# Patient Record
Sex: Female | Born: 1960 | Race: Black or African American | Hispanic: No | Smoking: Former smoker
Health system: Southern US, Community
[De-identification: ages and names within clinical notes are randomized; demographics above are authoritative.]

## PROBLEM LIST (undated history)

## (undated) DIAGNOSIS — I10 Essential (primary) hypertension: Secondary | ICD-10-CM

## (undated) DIAGNOSIS — G43909 Migraine, unspecified, not intractable, without status migrainosus: Secondary | ICD-10-CM

## (undated) DIAGNOSIS — K5900 Constipation, unspecified: Secondary | ICD-10-CM

## (undated) DIAGNOSIS — F419 Anxiety disorder, unspecified: Secondary | ICD-10-CM

## (undated) HISTORY — PX: ORTHOPEDIC SURGERY: SHX850

## (undated) HISTORY — PX: CHOLECYSTECTOMY: SHX55

## (undated) HISTORY — PX: TUBAL LIGATION: SHX77

---

## 2012-10-17 ENCOUNTER — Encounter (HOSPITAL_COMMUNITY): Payer: Self-pay | Admitting: *Deleted

## 2012-10-17 ENCOUNTER — Inpatient Hospital Stay (HOSPITAL_COMMUNITY)
Admission: EM | Admit: 2012-10-17 | Discharge: 2012-10-22 | DRG: 287 | Disposition: A | Discharge status: Home / Self Care | Payer: Medicare Other | Attending: Internal Medicine | Admitting: Internal Medicine

## 2012-10-17 ENCOUNTER — Emergency Department (HOSPITAL_COMMUNITY): Payer: Medicare Other

## 2012-10-17 DIAGNOSIS — F411 Generalized anxiety disorder: Secondary | ICD-10-CM | POA: Diagnosis present

## 2012-10-17 DIAGNOSIS — F4320 Adjustment disorder, unspecified: Secondary | ICD-10-CM

## 2012-10-17 DIAGNOSIS — R9439 Abnormal result of other cardiovascular function study: Secondary | ICD-10-CM | POA: Diagnosis not present

## 2012-10-17 DIAGNOSIS — R4189 Other symptoms and signs involving cognitive functions and awareness: Secondary | ICD-10-CM | POA: Diagnosis not present

## 2012-10-17 DIAGNOSIS — R55 Syncope and collapse: Secondary | ICD-10-CM

## 2012-10-17 DIAGNOSIS — R404 Transient alteration of awareness: Secondary | ICD-10-CM | POA: Diagnosis not present

## 2012-10-17 DIAGNOSIS — Z8249 Family history of ischemic heart disease and other diseases of the circulatory system: Secondary | ICD-10-CM

## 2012-10-17 DIAGNOSIS — R079 Chest pain, unspecified: Secondary | ICD-10-CM

## 2012-10-17 DIAGNOSIS — R7402 Elevation of levels of lactic acid dehydrogenase (LDH): Secondary | ICD-10-CM | POA: Diagnosis present

## 2012-10-17 DIAGNOSIS — R748 Abnormal levels of other serum enzymes: Secondary | ICD-10-CM

## 2012-10-17 DIAGNOSIS — K5909 Other constipation: Secondary | ICD-10-CM | POA: Diagnosis present

## 2012-10-17 DIAGNOSIS — R0789 Other chest pain: Principal | ICD-10-CM | POA: Diagnosis present

## 2012-10-17 DIAGNOSIS — R42 Dizziness and giddiness: Secondary | ICD-10-CM | POA: Diagnosis present

## 2012-10-17 DIAGNOSIS — K5900 Constipation, unspecified: Secondary | ICD-10-CM

## 2012-10-17 DIAGNOSIS — F4321 Adjustment disorder with depressed mood: Secondary | ICD-10-CM | POA: Diagnosis present

## 2012-10-17 DIAGNOSIS — R7401 Elevation of levels of liver transaminase levels: Secondary | ICD-10-CM

## 2012-10-17 HISTORY — DX: Anxiety disorder, unspecified: F41.9

## 2012-10-17 HISTORY — DX: Constipation, unspecified: K59.00

## 2012-10-17 LAB — CBC
Hemoglobin: 14.2 g/dL (ref 12.0–15.0)
MCHC: 34.7 g/dL (ref 30.0–36.0)
Platelets: 272 10*3/uL (ref 150–400)
RDW: 12.1 % (ref 11.5–15.5)

## 2012-10-17 LAB — BASIC METABOLIC PANEL
Calcium: 10.4 mg/dL (ref 8.4–10.5)
GFR calc Af Amer: >90 mL/min (ref >90)
GFR calc non Af Amer: >90 mL/min (ref >90)
Potassium: 4.1 mEq/L (ref 3.5–5.1)
Sodium: 138 mEq/L (ref 135–145)

## 2012-10-17 LAB — POCT I-STAT TROPONIN I: Troponin i, poc: 0.01 ng/mL (ref 0.00–0.08)

## 2012-10-17 MED ORDER — HYDROMORPHONE HCL PF 1 MG/ML IJ SOLN
1.0000 mg | Freq: Once | INTRAMUSCULAR | Status: AC
  Administered 2012-10-18: 1 mg via INTRAVENOUS
  Filled 2012-10-17: qty 1

## 2012-10-17 MED ORDER — ASPIRIN 81 MG PO CHEW
324.0000 mg | CHEWABLE_TABLET | Freq: Once | ORAL | Status: AC
  Administered 2012-10-17: 324 mg via ORAL
  Filled 2012-10-17: qty 4

## 2012-10-17 NOTE — ED Notes (Signed)
Pt returned from radiology.

## 2012-10-17 NOTE — ED Notes (Signed)
Pt c/o multiple "spells of passing out", last one was yesterday.  Also c/o CP and SOB, some diaphoresis.  Also c/o nausea, no vomiting.  Pt states her sister recently died and she has been stressed.

## 2012-10-17 NOTE — ED Provider Notes (Signed)
History     CSN: [id number]  Arrival date & time 10/17/12  2231   First MD Initiated Contact with Patient 10/17/12 2317      Chief Complaint  Patient presents with  . Chest Pain    (Consider location/radiation/quality/duration/timing/severity/associated sxs/prior treatment) HPI Hx per PT and family bedside.  CP and SOB with syncope yesterday.  PT states severe pain across her chest feels pressure like in quality, recent admit to [place], [place] hospital where she was admitted, told that over the weekend, there were no diagnostic tests available, so PT presents here with ongoing symptoms, no known cardiac history. Her sister recently passed away. No leg pain. Swelling or h/o PE/ DVT. No cough, fevers, chills, or injury related to syncope. No HA or neck pain.   Past Medical History  Diagnosis Date  . Anxiety   . Constipation     Past Surgical History  Procedure Laterality Date  . Tubal ligation      History reviewed. No pertinent family history.  History  Substance Use Topics  . Smoking status: Never Smoker   . Smokeless tobacco: Not on file  . Alcohol Use: No    OB History   Grav Para Term Preterm Abortions TAB SAB Ect Mult Living                  Review of Systems  Constitutional: Negative for fever and chills.  HENT: Negative for neck pain and neck stiffness.   Eyes: Negative for pain.  Respiratory: Positive for shortness of breath.   Cardiovascular: Positive for chest pain.  Gastrointestinal: Positive for nausea. Negative for abdominal pain.  Genitourinary: Negative for dysuria.  Musculoskeletal: Negative for back pain.  Skin: Negative for rash.  Neurological: Positive for syncope. Negative for seizures and headaches.  All other systems reviewed and are negative.    Allergies  Review of patient's allergies indicates no known allergies.  Home Medications   Current Outpatient Rx  Name  Route  Sig  Dispense  Refill  . polyethylene glycol (MIRALAX /  GLYCOLAX) packet   Oral   Take 17 g by mouth daily.         . SUMAtriptan (IMITREX) 50 MG tablet   Oral   Take 50 mg by mouth every 2 (two) hours as needed for migraine.         . venlafaxine XR (EFFEXOR-XR) 75 MG 24 hr capsule   Oral   Take 75 mg by mouth daily.           BP 139/89  Pulse 81  Temp(Src) 98 F (36.7 C) (Oral)  Resp 16  SpO2 98%  Physical Exam  Constitutional: She is oriented to person, place, and time. She appears well-developed and well-nourished.  HENT:  Head: Normocephalic and atraumatic.  Eyes: Conjunctivae and EOM are normal. Pupils are equal, round, and reactive to light.  Neck: Trachea normal. Neck supple. No thyromegaly present.  Cardiovascular: Normal rate, regular rhythm, S1 normal, S2 normal and normal pulses.     No systolic murmur is present   No diastolic murmur is present  Pulses:      Radial pulses are 2+ on the right side, and 2+ on the left side.  Pulmonary/Chest: Effort normal and breath sounds normal. She has no wheezes. She has no rhonchi. She has no rales. She exhibits no tenderness.  Abdominal: Soft. Normal appearance and bowel sounds are normal. There is no tenderness. There is no CVA tenderness and negative Murphy's sign.  Musculoskeletal:  BLE:s Calves nontender, no cords or erythema, negative Homans sign  Neurological: She is alert and oriented to person, place, and time. She has normal strength. No cranial nerve deficit or sensory deficit. GCS eye subscore is 4. GCS verbal subscore is 5. GCS motor subscore is 6.  Skin: Skin is warm and dry. No rash noted. She is not diaphoretic.  Psychiatric: Her speech is normal.  Cooperative and appropriate    ED Course  Procedures (including critical care time)  Results for orders placed during the hospital encounter of 10/17/12  CBC      Result Value Range   WBC 9.6  4.0 - 10.5 K/uL   RBC 4.77  3.87 - 5.11 MIL/uL   Hemoglobin 14.2  12.0 - 15.0 g/dL   HCT 16.1  09.6 - 04.5 %    MCV 85.7  78.0 - 100.0 fL   MCH 29.8  26.0 - 34.0 pg   MCHC 34.7  30.0 - 36.0 g/dL   RDW 40.9  81.1 - 91.4 %   Platelets 272  150 - 400 K/uL  BASIC METABOLIC PANEL      Result Value Range   Sodium 138  135 - 145 mEq/L   Potassium 4.1  3.5 - 5.1 mEq/L   Chloride 99  96 - 112 mEq/L   CO2 28  19 - 32 mEq/L   Glucose, Bld 119 (*) 70 - 99 mg/dL   BUN 8  6 - 23 mg/dL   Creatinine, Ser 7.82  0.50 - 1.10 mg/dL   Calcium 95.6  8.4 - 21.3 mg/dL   GFR calc non Af Amer >90  >90 mL/min   GFR calc Af Amer >90  >90 mL/min  POCT I-STAT TROPONIN I      Result Value Range   Troponin i, poc 0.01  0.00 - 0.08 ng/mL   Comment 3            Dg Chest 2 View  10/17/2012  *RADIOLOGY REPORT*  Clinical Data: Chest pain, shortness of breath, weakness, syncope 4 days ago.  CHEST - 2 VIEW  Comparison: None.  Findings: Shallow inspiration. The heart size and pulmonary vascularity are normal. The lungs appear clear and expanded without focal air space disease or consolidation. No blunting of the costophrenic angles.  No pneumothorax.  Mediastinal contours appear intact.  IMPRESSION: No evidence of active pulmonary disease.   Original Report Authenticated By: Burman Nieves, M.D.     Date: 10/17/2012  Rate: 77  Rhythm: normal sinus rhythm  QRS Axis: normal  Intervals: normal  ST/T Wave abnormalities: nonspecific ST changes  Conduction Disutrbances:none  Narrative Interpretation:   Old EKG Reviewed: none available  ASA. IV Dilaudid  MED consult for admit.   Old records requested from [place] recent admit  MDM  CP/ SOB / syncope in 52 yo female with recent death of a close family member  ECG, CXR, labs  ASA and IV narcotics, cardiac monitoring  MED admit        Sunnie Nielsen, MD 10/18/12 [phone]

## 2012-10-18 ENCOUNTER — Observation Stay (HOSPITAL_COMMUNITY): Payer: Medicare Other

## 2012-10-18 DIAGNOSIS — R7401 Elevation of levels of liver transaminase levels: Secondary | ICD-10-CM

## 2012-10-18 DIAGNOSIS — R55 Syncope and collapse: Secondary | ICD-10-CM

## 2012-10-18 DIAGNOSIS — F4321 Adjustment disorder with depressed mood: Secondary | ICD-10-CM

## 2012-10-18 DIAGNOSIS — R748 Abnormal levels of other serum enzymes: Secondary | ICD-10-CM

## 2012-10-18 DIAGNOSIS — K5900 Constipation, unspecified: Secondary | ICD-10-CM

## 2012-10-18 DIAGNOSIS — R079 Chest pain, unspecified: Secondary | ICD-10-CM

## 2012-10-18 LAB — COMPREHENSIVE METABOLIC PANEL
BUN: 10 mg/dL (ref 6–23)
CO2: 28 mEq/L (ref 19–32)
Calcium: 9.8 mg/dL (ref 8.4–10.5)
Creatinine, Ser: 0.72 mg/dL (ref 0.50–1.10)
GFR calc Af Amer: >90 mL/min (ref >90)
GFR calc non Af Amer: >90 mL/min (ref >90)
Glucose, Bld: 105 mg/dL — ABNORMAL HIGH (ref 70–99)

## 2012-10-18 LAB — HEMOGLOBIN A1C: Mean Plasma Glucose: 123 mg/dL — ABNORMAL HIGH (ref <117)

## 2012-10-18 LAB — CBC
Hemoglobin: 13.2 g/dL (ref 12.0–15.0)
MCH: 30.2 pg (ref 26.0–34.0)
MCV: 85.8 fL (ref 78.0–100.0)
RBC: 4.37 MIL/uL (ref 3.87–5.11)

## 2012-10-18 LAB — D-DIMER, QUANTITATIVE: D-Dimer, Quant: <0.27 ug/mL-FEU (ref 0.00–0.48)

## 2012-10-18 LAB — CK: Total CK: 260 U/L — ABNORMAL HIGH (ref 7–177)

## 2012-10-18 MED ORDER — NITROGLYCERIN 0.4 MG SL SUBL
SUBLINGUAL_TABLET | SUBLINGUAL | Status: AC
  Administered 2012-10-18: 20:00:00
  Filled 2012-10-18: qty 25

## 2012-10-18 MED ORDER — DOCUSATE SODIUM 100 MG PO CAPS
100.0000 mg | ORAL_CAPSULE | Freq: Two times a day (BID) | ORAL | Status: DC
  Administered 2012-10-18 – 2012-10-22 (×8): 100 mg via ORAL
  Filled 2012-10-18 (×10): qty 1

## 2012-10-18 MED ORDER — ACETAMINOPHEN 650 MG RE SUPP: 650.0000 mg | Freq: Four times a day (QID) | RECTAL | Status: DC | PRN

## 2012-10-18 MED ORDER — ONDANSETRON HCL 4 MG PO TABS: 4.0000 mg | ORAL_TABLET | Freq: Four times a day (QID) | ORAL | Status: DC | PRN

## 2012-10-18 MED ORDER — ONDANSETRON HCL 4 MG/2ML IJ SOLN
4.0000 mg | Freq: Four times a day (QID) | INTRAMUSCULAR | Status: DC | PRN
  Administered 2012-10-18: 4 mg via INTRAVENOUS
  Filled 2012-10-18: qty 2

## 2012-10-18 MED ORDER — HEPARIN SODIUM (PORCINE) 5000 UNIT/ML IJ SOLN
5000.0000 [IU] | Freq: Three times a day (TID) | INTRAMUSCULAR | Status: DC
  Administered 2012-10-18 – 2012-10-21 (×8): 5000 [IU] via SUBCUTANEOUS
  Filled 2012-10-18 (×13): qty 1

## 2012-10-18 MED ORDER — ASPIRIN EC 325 MG PO TBEC
325.0000 mg | DELAYED_RELEASE_TABLET | Freq: Every day | ORAL | Status: DC
  Administered 2012-10-18 – 2012-10-22 (×4): 325 mg via ORAL
  Filled 2012-10-18 (×5): qty 1

## 2012-10-18 MED ORDER — MECLIZINE HCL 25 MG PO TABS
25.0000 mg | ORAL_TABLET | Freq: Two times a day (BID) | ORAL | Status: DC | PRN
  Administered 2012-10-18: 25 mg via ORAL
  Filled 2012-10-18 (×2): qty 1

## 2012-10-18 MED ORDER — SODIUM CHLORIDE 0.9 % IJ SOLN
3.0000 mL | Freq: Two times a day (BID) | INTRAMUSCULAR | Status: DC
  Administered 2012-10-18 – 2012-10-21 (×8): 3 mL via INTRAVENOUS

## 2012-10-18 MED ORDER — POLYETHYLENE GLYCOL 3350 17 G PO PACK
17.0000 g | PACK | Freq: Two times a day (BID) | ORAL | Status: DC
  Administered 2012-10-18 – 2012-10-21 (×7): 17 g via ORAL
  Filled 2012-10-18 (×10): qty 1

## 2012-10-18 MED ORDER — POLYETHYLENE GLYCOL 3350 17 G PO PACK: 17.0000 g | PACK | Freq: Every day | ORAL | Status: DC | PRN

## 2012-10-18 MED ORDER — ACETAMINOPHEN 325 MG PO TABS: 650.0000 mg | ORAL_TABLET | Freq: Four times a day (QID) | ORAL | Status: DC | PRN

## 2012-10-18 MED ORDER — SODIUM CHLORIDE 0.9 % IJ SOLN
3.0000 mL | INTRAMUSCULAR | Status: DC | PRN
  Administered 2012-10-18: 3 mL via INTRAVENOUS

## 2012-10-18 MED ORDER — SODIUM CHLORIDE 0.9 % IV SOLN: 250.0000 mL | INTRAVENOUS | Status: DC | PRN

## 2012-10-18 MED ORDER — HEPARIN SODIUM (PORCINE) 5000 UNIT/ML IJ SOLN
5000.0000 [IU] | Freq: Three times a day (TID) | INTRAMUSCULAR | Status: DC
  Filled 2012-10-18: qty 1

## 2012-10-18 NOTE — Progress Notes (Signed)
[facility] PROGRESS NOTE  Stacy Bowman WUJ:[id number] DOB: [birth year] DOA: 10/17/2012 PCP: [name], MD  Assessment/Plan: 1. Chest pain: with typical and atypical features (left arm and neck pain, diaphoresis, SOB). EKG sinus rhythm, no acute changes. Cardiac enzymes negative thus far. No h/o CAD or cardiac testing. Will ask cardiology for opinion. Continue aspirin. Well's score 0, VTE doubted, will not proceed with any further evaluation. 2. Syncope, dizziness?: Etiology and significance unclear. No arrhythmia documented. Nonorganic etiology possible. Monitor on telemetry. Check 2-D echocardiogram. 3. Transaminitis: Etiology and significance unclear. Not on a statin. Repeat study in the morning. Suggest outpatient followup, consider hepatitis panel if not recently done. 4. Elevated CK: Seen at [facility] Repeat CPK with next blood draw.  5. Fatigue: TSH normal. Suspect secondary to stress, acute grief reaction. 6. Abdominal pain, constipation: Bowel regimen.  7. Grief, situational stress: Recently buried her sister.  Record review: Admitted to [place], [place] hospital 2/22-2/24 for chest pain. History and physical and progress notes available, no discharge summary, EKG. Serial cardiac enzymes were negative. Lab work was notable for CK over 1000 of unclear etiology. CT head negative, chest x-ray negative.  Per admission history and physical apparently as her admission was over the weekend no diagnostic tests were available so she came here for further evaluation of her pain.   Code Status: full code Family Communication: none present Disposition Plan: home when improved  Brendia Sacks, MD  [facility] Team 7 Pager [phone] If 7PM-7AM, please contact night-coverage at [email or web address], password [facility] 10/18/2012, 9:51 AM  LOS: 1 day   Brief narrative: 52 y.o. year old female chest pain. Patient was for similar symptoms at [facility] over the  weekend. Per patient and daughter patient had blood work as well as imaging done. Family is on sure results of imaging and tests. Patient was discharged yesterday without a formal diagnosis per report. Patient reports having central chest pain that radiates to the neck jaw/shoulder area with associated nausea and diaphoresis. Symptoms were similar over the weekend. Pain feels like someone sitting on her chest. Pain is sharp 9/10 at its worse. No alleviating or aggravating factors. Pain is not worsened with exertion. No past history of hypertension, diabetes. No family history of early heart attack. Patient also reports some intermittent dizziness with feeling of the room spinning going from sitting to standing. Patient also was having some left-sided weakness/numbness that has been going on the past 3 months. Primary care physician is in [place]. This has been discussed with primary care physician the past. Patient denies a formal workup. Symptoms have been fairly stable with no acute worsening today. Patient also for some mild abdominal pain. Nonspecific without radiation. Patient does have to strain have a bowel movement.  Consultants:  [facility] cardiology  Procedures:    HPI/Subjective: Afebrile, vital signs stable. No CP or SOB now. No complaints at the moment.   Objective: Filed Vitals:   10/18/12 0030 10/18/12 0100 10/18/12 0205 10/18/12 0523  BP: 137/88 131/66 125/78 114/71  Pulse: 76 82  72  Temp:   97.8 F (36.6 C) 98.3 F (36.8 C)  TempSrc:   Oral Oral  Resp: 14 13 16 16   Height:   5\' 4"  (1.626 m)   Weight:   81.4 kg (179 lb 7.3 oz)   SpO2: 92% 94% 100% 99%    Intake/Output Summary (Last 24 hours) at 10/18/12 0951 Last data filed at 10/18/12 0546  Gross per 24 hour  Intake  0 ml  Output    400 ml  Net   -400 ml   Filed Weights   10/18/12 0205  Weight: 81.4 kg (179 lb 7.3 oz)    Exam:  General:  Appears calm and comfortable. Keeps eyes closed during interview  and examination. Laconic.  Cardiovascular: RRR, no m/r/g. No LE edema. Telemetry: SR, no arrhythmias  Respiratory: CTA bilaterally, no w/r/r. Normal respiratory effort. Abdomen: soft, ntnd Psychiatric: Appears depressed. Flat affect. Speech fluent and clear.  Data Reviewed: Basic Metabolic Panel:  Recent Labs Lab 10/17/12 2248 10/18/12 0113 10/18/12 0719  NA 138  --  138  K 4.1  --  4.0  CL 99  --  100  CO2 28  --  28  GLUCOSE 119*  --  105*  BUN 8  --  10  CREATININE 0.79  --  0.72  CALCIUM 10.4  --  9.8  PHOS  --  4.2  --    Liver Function Tests:  Recent Labs Lab 10/18/12 0719  AST 106*  ALT 106*  ALKPHOS 86  BILITOT 0.2*  PROT 7.9  ALBUMIN 3.7   CBC:  Recent Labs Lab 10/17/12 2248 10/18/12 0719  WBC 9.6 7.8  HGB 14.2 13.2  HCT 40.9 37.5  MCV 85.7 85.8  PLT 272 241   Cardiac Enzymes:  Recent Labs Lab 10/18/12 0134 10/18/12 0718  TROPONINI <0.30 <0.30    Studies: Dg Chest 2 View  10/17/2012  *RADIOLOGY REPORT*  Clinical Data: Chest pain, shortness of breath, weakness, syncope 4 days ago.  CHEST - 2 VIEW  Comparison: None.  Findings: Shallow inspiration. The heart size and pulmonary vascularity are normal. The lungs appear clear and expanded without focal air space disease or consolidation. No blunting of the costophrenic angles.  No pneumothorax.  Mediastinal contours appear intact.  IMPRESSION: No evidence of active pulmonary disease.   Original Report Authenticated By: Burman Nieves, M.D.    Abd 1 View (kub)  10/18/2012  *RADIOLOGY REPORT*  Clinical Data: 52 year old female with abdominal pain and vomiting.  ABDOMEN - 1 VIEW  Comparison: None  Findings: The bowel gas pattern is unremarkable with stool and gas in the colon. No dilated bowel loops are present. Cholecystectomy clips are identified. No suspicious calcifications are present. The bony abnormalities are identified.  IMPRESSION: Unremarkable bowel gas pattern.  No evidence of acute  abnormality.   Original Report Authenticated By: Harmon Pier, M.D.     Scheduled Meds: . aspirin EC  325 mg Oral Daily  . heparin  5,000 Units Subcutaneous Q8H  . sodium chloride  3 mL Intravenous Q12H   Continuous Infusions:   Principal Problem:   Chest pain Active Problems:   Syncope   Transaminitis   Elevated CK   Unspecified constipation   Grief reaction     Brendia Sacks, MD  [facility] Team 7 Pager [phone] If 7PM-7AM, please contact night-coverage at [email or web address], password [facility] 10/18/2012, 9:51 AM  LOS: 1 day   Time spent: 25 minutes

## 2012-10-18 NOTE — Consult Note (Signed)
Admit date: 10/17/2012 Referring Physician  Dr. Alvester Morin Primary Physician  Dr. Barrington Ellison Primary Cardiologist  NONE Reason for Consultation  Chest pain  HPI: This is a 52 y.o. year old female who presented with chest pain. Patient was seen for similar symptoms at [facility] over the weekend. Per patient and daughter patient had blood work as well as imaging done. Family is unsure results of imaging and tests. Patient was discharged yesterday without a formal diagnosis per report. Patient reports having central chest pain that radiates to the neck jaw/shoulder area with associated nausea and diaphoresis. Symptoms were similar over the weekend. Pain feels like someone sitting on her chest. Pain is sharp 9/10 at its worse. No alleviating or aggravating factors. Pain is not worsened with exertion. No past history of hypertension, diabetes. No family history of early heart attack. Patient also reports some intermittent dizziness with feeling of the room spinning going from sitting to standing. Patient also was having some left-sided weakness/numbness that has been going on the past 3 months. Primary care physician is in [place]. This has been discussed with primary care physician the past. Patient denies a formal workup. Symptoms have been fairly stable with no acute worsening today. Patient also for some mild abdominal pain. Nonspecific without radiation. Patient does have to strain have a bowel movement. We are now asked to consult for further evaluation of chest pain.       PMH:   Past Medical History  Diagnosis Date  . Anxiety   . Constipation      PSH:   Past Surgical History  Procedure Laterality Date  . Tubal ligation      Allergies:  Review of patient's allergies indicates no known allergies. Prior to Admit Meds:   Prescriptions prior to admission  Medication Sig Dispense Refill  . polyethylene glycol (MIRALAX / GLYCOLAX) packet Take 17 g by mouth daily.      . SUMAtriptan  (IMITREX) 50 MG tablet Take 50 mg by mouth every 2 (two) hours as needed for migraine.      . venlafaxine XR (EFFEXOR-XR) 75 MG 24 hr capsule Take 75 mg by mouth daily.       Fam HX:   History reviewed. No pertinent family history. Social HX:    History   Social History  . Marital Status: Divorced    Spouse Name: N/A    Number of Children: N/A  . Years of Education: N/A   Occupational History  . Not on file.   Social History Main Topics  . Smoking status: Never Smoker   . Smokeless tobacco: Not on file  . Alcohol Use: No  . Drug Use: No  . Sexually Active: Not on file   Other Topics Concern  . Not on file   Social History Narrative  . No narrative on file     ROS:  All 11 ROS were addressed and are negative except what is stated in the HPI  Physical Exam: Blood pressure 114/71, pulse 72, temperature 98.3 F (36.8 C), temperature source Oral, resp. rate 16, height 5\' 4"  (1.626 m), weight 81.4 kg (179 lb 7.3 oz), SpO2 99.00%.    General: Well developed, well nourished, in no acute distress Head: Eyes PERRLA, No xanthomas.   Normal cephalic and atramatic  Lungs:   Clear bilaterally to auscultation and percussion. Heart:   HRRR S1 S2 Pulses are 2+ & equal.            No carotid bruit. No JVD.  No abdominal bruits. No femoral bruits. Abdomen: Bowel sounds are positive, abdomen soft and non-tender without masses  Extremities:   No clubbing, cyanosis or edema.  DP +1 Neuro: Alert and oriented X 3. Psych:  Good affect, responds appropriately    Labs:   Lab Results  Component Value Date   WBC 7.8 10/18/2012   HGB 13.2 10/18/2012   HCT 37.5 10/18/2012   MCV 85.8 10/18/2012   PLT 241 10/18/2012    Recent Labs Lab 10/18/12 0719  NA 138  K 4.0  CL 100  CO2 28  BUN 10  CREATININE 0.72  CALCIUM 9.8  PROT 7.9  BILITOT 0.2*  ALKPHOS 86  ALT 106*  AST 106*  GLUCOSE 105*   No results found for this basename: PTT   No results found for this basename: INR, PROTIME    Lab Results  Component Value Date   TROPONINI <0.30 10/18/2012         Radiology:  Dg Chest 2 View  10/17/2012  *RADIOLOGY REPORT*  Clinical Data: Chest pain, shortness of breath, weakness, syncope 4 days ago.  CHEST - 2 VIEW  Comparison: None.  Findings: Shallow inspiration. The heart size and pulmonary vascularity are normal. The lungs appear clear and expanded without focal air space disease or consolidation. No blunting of the costophrenic angles.  No pneumothorax.  Mediastinal contours appear intact.  IMPRESSION: No evidence of active pulmonary disease.   Original Report Authenticated By: Burman Nieves, M.D.    Abd 1 View (kub)  10/18/2012  *RADIOLOGY REPORT*  Clinical Data: 52 year old female with abdominal pain and vomiting.  ABDOMEN - 1 VIEW  Comparison: None  Findings: The bowel gas pattern is unremarkable with stool and gas in the colon. No dilated bowel loops are present. Cholecystectomy clips are identified. No suspicious calcifications are present. The bony abnormalities are identified.  IMPRESSION: Unremarkable bowel gas pattern.  No evidence of acute abnormality.   Original Report Authenticated By: Harmon Pier, M.D.    EKG:  NSR with nonspecific T wave abnormality  ASSESSMENT:  1.  Atypical and typical chest pain with negative troponin x 3 and normal EKG.  CRF include family history of CAD (mom with stents).  2.  Elevated CPK at outside hospital 3.  Transaminitis of ? etiology  PLAN:   1.  Repeat CPK 2.  Check 2D echo to eval LVF 3.  NPO after midnight for nuclear stress test in am  Quintella Reichert, MD  10/18/2012  12:07 PM

## 2012-10-18 NOTE — Progress Notes (Signed)
UR Completed.  Kobie Whidby Jane [phone] 10/18/2012  

## 2012-10-18 NOTE — Care Management Note (Unsigned)
    Page 1 of 1   10/18/2012     3:11:24 PM   CARE MANAGEMENT NOTE 10/18/2012  Patient:  Stacy Bowman, Stacy Bowman   Account Number:  [id number]  Date Initiated:  10/18/2012  Documentation initiated by:  Roneisha Stern  Subjective/Objective Assessment:   PT ADM WITH CP, SYNCOPE ON 10/17/12.  PTA, PT INDEPENDENT, LIVES WITH DAUGHTER.     Action/Plan:   WILL FOLLOW FOR HOME NEEDS AS PT PROGRESSES.   Anticipated DC Date:  10/19/2012   Anticipated DC Plan:  HOME/SELF CARE      DC Planning Services  CM consult      Choice offered to / List presented to:             Status of service:  In process, will continue to follow Medicare Important Message given?   (If response is "NO", the following Medicare IM given date fields will be blank) Date Medicare IM given:   Date Additional Medicare IM given:    Discharge Disposition:    Per UR Regulation:  Reviewed for med. necessity/level of care/duration of stay  If discussed at Long Length of Stay Meetings, dates discussed:    Comments:

## 2012-10-18 NOTE — Progress Notes (Signed)
Pt complained of chest pain, 8/10, feeling like pressure on her left chest.  One Nitroglycerin sublingual given. Pt states relief. VS stable, will continue to monitor.

## 2012-10-18 NOTE — Progress Notes (Signed)
*  PRELIMINARY RESULTS* Echocardiogram 2D Echocardiogram has been performed.  Stacy Bowman 10/18/2012, 4:36 PM

## 2012-10-18 NOTE — H&P (Signed)
Hospitalist Admission History and Physical  Patient name: Stacy Bowman Medical record number: [id number] Date of birth: 09/26/1960 Age: 52 y.o. Gender: female  Primary Care Provider: [name], MD  Chief Complaint: Chest Pain  History of Present Illness:This is a 52 y.o. year old female chest pain. Patient was for similar symptoms at [facility] over the weekend. Per patient and daughter patient had blood work as well as imaging done. Family is on sure results of imaging and tests. Patient was discharged yesterday without a formal diagnosis per report. Patient reports having central chest pain that radiates to the neck jaw/shoulder area with associated nausea and diaphoresis. Symptoms were similar over the weekend. Pain feels like someone sitting on her chest. Pain is sharp 9/10 at its worse. No alleviating or aggravating factors. Pain is not worsened with exertion. No past history of hypertension, diabetes. No family history of early heart attack. Patient also reports some intermittent dizziness with feeling of the room spinning going from sitting to standing. Patient also was having some left-sided weakness/numbness that has been going on the past 3 months. Primary care physician is in [place]. This has been discussed with primary care physician the past. Patient denies a formal workup. Symptoms have been fairly stable with no acute worsening today. Patient also for some mild abdominal pain. Nonspecific without radiation. Patient does have to strain have a bowel movement.  Per report from, patient's sister recently died last week to 2 liver failure. Patient with a prior history of depression. Patient has been under tremendous amount of stress related to this. Patient is also been very fatigued. No homicidal or suicidal ideations.  There is no problem list on file for this patient.  Past Medical History: Past Medical History  Diagnosis Date  . Anxiety   . Constipation      Past Surgical History: Past Surgical History  Procedure Laterality Date  . Tubal ligation      Social History: History   Social History  . Marital Status: Divorced    Spouse Name: N/A    Number of Children: N/A  . Years of Education: N/A   Social History Main Topics  . Smoking status: Never Smoker   . Smokeless tobacco: None  . Alcohol Use: No  . Drug Use: No  . Sexually Active: None   Other Topics Concern  . None   Social History Narrative  . None    Family History: History reviewed. No pertinent family history.  Allergies: No Known Allergies  Current Facility-Administered Medications  Medication Dose Route Frequency Provider Last Rate Last Dose  . 0.9 %  sodium chloride infusion  250 mL Intravenous PRN Doree Albee, MD      . acetaminophen (TYLENOL) tablet 650 mg  650 mg Oral Q6H PRN Doree Albee, MD       Or  . acetaminophen (TYLENOL) suppository 650 mg  650 mg Rectal Q6H PRN Doree Albee, MD      . aspirin EC tablet 325 mg  325 mg Oral Daily Doree Albee, MD      . heparin injection 5,000 Units  5,000 Units Subcutaneous Q8H Doree Albee, MD      . ondansetron [facility]) tablet 4 mg  4 mg Oral Q6H PRN Doree Albee, MD       Or  . ondansetron [facility]) injection 4 mg  4 mg Intravenous Q6H PRN Doree Albee, MD      . polyethylene glycol (MIRALAX / GLYCOLAX) packet 17 g  17 g Oral  Daily PRN Doree Albee, MD      . sodium chloride 0.9 % injection 3 mL  3 mL Intravenous Q12H Doree Albee, MD      . sodium chloride 0.9 % injection 3 mL  3 mL Intravenous PRN Doree Albee, MD       Current Outpatient Prescriptions  Medication Sig Dispense Refill  . polyethylene glycol (MIRALAX / GLYCOLAX) packet Take 17 g by mouth daily.      . SUMAtriptan (IMITREX) 50 MG tablet Take 50 mg by mouth every 2 (two) hours as needed for migraine.      . venlafaxine XR (EFFEXOR-XR) 75 MG 24 hr capsule Take 75 mg by mouth daily.       Review Of Systems: 12 point ROS negative  except as noted above in HPI.  Physical Exam: Filed Vitals:   10/18/12 0030  BP: 137/88  Pulse: 76  Temp:   Resp: 14    General: alert and cooperative HEENT: PERRLA and extra ocular movement intact Heart: S1, S2 normal, no murmur, rub or gallop, regular rate and rhythm Lungs: clear to auscultation, no wheezes or rales and unlabored breathing Abdomen: abdomen is soft without significant tenderness, masses, organomegaly or guarding Extremities: extremities normal, atraumatic, no cyanosis or edema Skin:no rashes Neurology: normal without focal findings, mental status, speech normal, alert and oriented x3, PERLA, reflexes normal and symmetric and dix hallpike mildly positive   Labs and Imaging: Lab Results  Component Value Date/Time   NA 138 10/17/2012 10:48 PM   K 4.1 10/17/2012 10:48 PM   CL 99 10/17/2012 10:48 PM   CO2 28 10/17/2012 10:48 PM   BUN 8 10/17/2012 10:48 PM   CREATININE 0.79 10/17/2012 10:48 PM   GLUCOSE 119* 10/17/2012 10:48 PM   Lab Results  Component Value Date   WBC 9.6 10/17/2012   HGB 14.2 10/17/2012   HCT 40.9 10/17/2012   MCV 85.7 10/17/2012   PLT 272 10/17/2012   Dg Chest 2 View  10/17/2012  *RADIOLOGY REPORT*  Clinical Data: Chest pain, shortness of breath, weakness, syncope 4 days ago.  CHEST - 2 VIEW  Comparison: None.  Findings: Shallow inspiration. The heart size and pulmonary vascularity are normal. The lungs appear clear and expanded without focal air space disease or consolidation. No blunting of the costophrenic angles.  No pneumothorax.  Mediastinal contours appear intact.  IMPRESSION: No evidence of active pulmonary disease.   Original Report Authenticated By: Burman Nieves, M.D.    Troponin (Point of Care Test)  Recent Labs  10/17/12 2311  TROPIPOC 0.01   EKG: normal EKG, normal sinus rhythm.    Assessment and Plan: Stacy Bowman is a 52 y.o. year old female presenting with chest pain.   Chest Pain: We'll proceed with chest pain rule  out. EKG normal sinus rhythm. Point care troponins within normal limits. We'll cycle cardiac enzymes. Risk stratification labs. I suspect the patient had a similar workup with recent admission. Medical records are pending. Patient given full dose aspirin in the interim. Cards consult in the a.m. pending medical records.  Fatigue: Suspect that mood/depression/poor sleep in setting of sister recently dying may be playing a large part of this. No anemia on CBC. TSH is pending. We'll check vitamin D level. Pending medical records from [place].   Dizziness: Clinically consistent with vertigo. Will start on meclizine. Will on any neuroimaging pending medical records. Neurologically intact apart from vertiginous sxs.   Abdominal pain: Fairly nonspecific but no palpable tenderness on my  exam today. Suspect this may be secondary to constipation. Will check a KUB. Outside hospital records pending.  FEN/GI: Heart healthy diet. Prophylaxis: Subcutaneous heparin. PPI Disposition: Pending further evaluation Code Status: Full Code        Doree Albee MD  Pager: [phone]

## 2012-10-19 ENCOUNTER — Encounter (HOSPITAL_COMMUNITY): Payer: Medicare Other

## 2012-10-19 ENCOUNTER — Observation Stay (HOSPITAL_COMMUNITY): Payer: Medicare Other

## 2012-10-19 LAB — HEPATIC FUNCTION PANEL
ALT: 114 U/L — ABNORMAL HIGH (ref 0–35)
Bilirubin, Direct: <0.1 mg/dL (ref 0.0–0.3)

## 2012-10-19 MED ORDER — TECHNETIUM TC 99M SESTAMIBI GENERIC - CARDIOLITE
10.0000 | Freq: Once | INTRAVENOUS | Status: AC | PRN
  Administered 2012-10-19: 10 via INTRAVENOUS

## 2012-10-19 MED ORDER — REGADENOSON 0.4 MG/5ML IV SOLN
0.4000 mg | Freq: Once | INTRAVENOUS | Status: AC
  Administered 2012-10-19: 0.4 mg via INTRAVENOUS
  Filled 2012-10-19: qty 5

## 2012-10-19 MED ORDER — TECHNETIUM TC 99M SESTAMIBI GENERIC - CARDIOLITE
30.0000 | Freq: Once | INTRAVENOUS | Status: AC | PRN
  Administered 2012-10-19: 30 via INTRAVENOUS

## 2012-10-19 NOTE — Progress Notes (Signed)
SUBJECTIVE:  Had recurrent cp relieved with NTG  OBJECTIVE:   Vitals:   Filed Vitals:   10/18/12 1436 10/18/12 2000 10/19/12 0424 10/19/12 0500  BP: 119/74 111/75 112/68   Pulse: 75 88 87   Temp: 98 F (36.7 C) 98.7 F (37.1 C) 98.3 F (36.8 C)   TempSrc: Oral Oral Oral   Resp: 18 18 16    Height:      Weight:    80.9 kg (178 lb 5.6 oz)  SpO2: 99% 98% 95%    I&O's:   Intake/Output Summary (Last 24 hours) at 10/19/12 0748 Last data filed at 10/18/12 1300  Gross per 24 hour  Intake    240 ml  Output      0 ml  Net    240 ml   TELEMETRY: Reviewed telemetry pt in NSR:     PHYSICAL EXAM General: Well developed, well nourished, in no acute distress Head: Eyes PERRLA, No xanthomas.   Normal cephalic and atramatic  Lungs:   Clear bilaterally to auscultation and percussion. Heart:   HRRR S1 S2 Pulses are 2+ & equal.            No carotid bruit. No JVD.  No abdominal bruits. No femoral bruits. Abdomen: Bowel sounds are positive, abdomen soft and non-tender without masses Extremities:   No clubbing, cyanosis or edema.  DP +1 Neuro: Alert and oriented X 3. Psych:  Good affect, responds appropriately   LABS: Basic Metabolic Panel:  Recent Labs  08/65/78 2248 10/18/12 0113 10/18/12 0719  NA 138  --  138  K 4.1  --  4.0  CL 99  --  100  CO2 28  --  28  GLUCOSE 119*  --  105*  BUN 8  --  10  CREATININE 0.79  --  0.72  CALCIUM 10.4  --  9.8  PHOS  --  4.2  --    Liver Function Tests:  Recent Labs  10/18/12 0719  AST 106*  ALT 106*  ALKPHOS 86  BILITOT 0.2*  PROT 7.9  ALBUMIN 3.7   No results found for this basename: LIPASE, AMYLASE,  in the last 72 hours CBC:  Recent Labs  10/17/12 2248 10/18/12 0719  WBC 9.6 7.8  HGB 14.2 13.2  HCT 40.9 37.5  MCV 85.7 85.8  PLT 272 241   Cardiac Enzymes:  Recent Labs  10/18/12 0134 10/18/12 0718 10/18/12 1238  CKTOTAL  --   --  260*  TROPONINI <0.30 <0.30 <0.30   BNP: No components found with this  basename: POCBNP,  D-Dimer:  Recent Labs  10/17/12 2348  DDIMER <0.27   Hemoglobin A1C:  Recent Labs  10/18/12 0719  HGBA1C 5.9*   Fasting Lipid Panel: No results found for this basename: CHOL, HDL, LDLCALC, TRIG, CHOLHDL, LDLDIRECT,  in the last 72 hours Thyroid Function Tests:  Recent Labs  10/18/12 0113  TSH 2.867   Anemia Panel: No results found for this basename: VITAMINB12, FOLATE, FERRITIN, TIBC, IRON, RETICCTPCT,  in the last 72 hours Coag Panel:   No results found for this basename: INR, PROTIME    RADIOLOGY: Dg Chest 2 View  10/17/2012  *RADIOLOGY REPORT*  Clinical Data: Chest pain, shortness of breath, weakness, syncope 4 days ago.  CHEST - 2 VIEW  Comparison: None.  Findings: Shallow inspiration. The heart size and pulmonary vascularity are normal. The lungs appear clear and expanded without focal air space disease or consolidation. No blunting of the costophrenic angles.  No pneumothorax.  Mediastinal contours appear intact.  IMPRESSION: No evidence of active pulmonary disease.   Original Report Authenticated By: Burman Nieves, M.D.    Abd 1 View (kub)  10/18/2012  *RADIOLOGY REPORT*  Clinical Data: 52 year old female with abdominal pain and vomiting.  ABDOMEN - 1 VIEW  Comparison: None  Findings: The bowel gas pattern is unremarkable with stool and gas in the colon. No dilated bowel loops are present. Cholecystectomy clips are identified. No suspicious calcifications are present. The bony abnormalities are identified.  IMPRESSION: Unremarkable bowel gas pattern.  No evidence of acute abnormality.   Original Report Authenticated By: Harmon Pier, M.D.    ASSESSMENT:  1. Atypical and typical chest pain with negative troponin x 3 and normal EKG. CRF include family history of CAD (mom with stents).  2. Elevated CPK at outside hospital  3. Transaminitis of ? etiology  PLAN:  1. Check 2D echo results  to eval LVF  2.  nuclear stress test today      Quintella Reichert, MD  10/19/2012  7:48 AM

## 2012-10-19 NOTE — Progress Notes (Signed)
[facility] PROGRESS NOTE  Stacy Bowman WUJ:[id number] DOB: 04/29/1961 DOA: 10/17/2012 PCP: [name], MD  Brief narrative: 52 y.o. year old female chest pain. Patient was for similar symptoms at [facility] over the weekend. Per patient and daughter patient had blood work as well as imaging done. Family is on sure results of imaging and tests. Patient was discharged yesterday without a formal diagnosis per report. Patient reports having central chest pain that radiates to the neck jaw/shoulder area with associated nausea and diaphoresis. Symptoms were similar over the weekend. Pain feels like someone sitting on her chest. Pain is sharp 9/10 at its worse. No alleviating or aggravating factors. Pain is not worsened with exertion. No past history of hypertension, diabetes. No family history of early heart attack. Patient also reports some intermittent dizziness with feeling of the room spinning going from sitting to standing. Patient also was having some left-sided weakness/numbness that has been going on the past 3 months. Primary care physician is in [place]. This has been discussed with primary care physician the past. Patient denies a formal workup. Symptoms have been fairly stable with no acute worsening today. Patient also for some mild abdominal pain. Nonspecific without radiation. Patient does have to strain have a bowel movement.  Assessment/Plan: 1. Chest pain: with typical and atypical features (left arm and neck pain, diaphoresis, SOB). EKG sinus rhythm, no acute changes. Cardiac enzymes negative thus far. Stress test done today, results pending. Appreciate cardiology input.  2. Syncope, dizziness?: Etiology and significance unclear. No arrhythmia documented. Nonorganic etiology possible. Monitor on telemetry. Check 2-D echocardiogram. 3. Transaminitis: Etiology and significance unclear. Not on a statin. Repeat study in the morning. Suggest outpatient followup,  consider hepatitis panel if not recently done. 4. Elevated CK: Seen at [facility]. Repeat CPK with next blood draw.  5. Fatigue: TSH normal. Suspect secondary to stress, acute grief reaction. 6. Abdominal pain, constipation: Bowel regimen.  7. Grief, situational stress: Recently buried her sister.  Code Status: full code Family Communication: none present Disposition Plan: home when improved  Consultants:  [facility]  Procedures:  Stress test 10/19/2012 - results pending.   HPI/Subjective: - complains of being hungry today.   Objective: Filed Vitals:   10/19/12 1256 10/19/12 1258 10/19/12 1259 10/19/12 1440  BP: 98/68  113/77 117/75  Pulse: 110 132 118 94  Temp:    98.5 F (36.9 C)  TempSrc:    Stacy  Resp:    18  Height:      Weight:      SpO2:    95%   No intake or output data in the 24 hours ending 10/19/12 1513 Filed Weights   10/18/12 0205 10/19/12 0500  Weight: 81.4 kg (179 lb 7.3 oz) 80.9 kg (178 lb 5.6 oz)    Exam: General:  Appears calm and comfortable. Keeps eyes closed during interview and examination. Cardiovascular: RRR, no m/r/g. No LE edema. Telemetry: SR, no arrhythmias  Respiratory: CTA bilaterally, no w/r/r. Normal respiratory effort. Abdomen: soft, ntnd Psychiatric: Appears depressed. Flat affect. Speech fluent and clear.  Data Reviewed: Basic Metabolic Panel:  Recent Labs Lab 10/17/12 2248 10/18/12 0113 10/18/12 0719  NA 138  --  138  K 4.1  --  4.0  CL 99  --  100  CO2 28  --  28  GLUCOSE 119*  --  105*  BUN 8  --  10  CREATININE 0.79  --  0.72  CALCIUM 10.4  --  9.8  PHOS  --  4.2  --    Liver Function Tests:  Recent Labs Lab 10/18/12 0719  AST 106*  ALT 106*  ALKPHOS 86  BILITOT 0.2*  PROT 7.9  ALBUMIN 3.7   CBC:  Recent Labs Lab 10/17/12 2248 10/18/12 0719  WBC 9.6 7.8  HGB 14.2 13.2  HCT 40.9 37.5  MCV 85.7 85.8  PLT 272 241   Cardiac Enzymes:  Recent Labs Lab 10/18/12 0134  10/18/12 0718 10/18/12 1238  CKTOTAL  --   --  260*  TROPONINI <0.30 <0.30 <0.30   Studies: Dg Chest 2 View  10/17/2012  *RADIOLOGY REPORT*  Clinical Data: Chest pain, shortness of breath, weakness, syncope 4 days ago.  CHEST - 2 VIEW  Comparison: None.  Findings: Shallow inspiration. The heart size and pulmonary vascularity are normal. The lungs appear clear and expanded without focal air space disease or consolidation. No blunting of the costophrenic angles.  No pneumothorax.  Mediastinal contours appear intact.  IMPRESSION: No evidence of active pulmonary disease.   Original Report Authenticated By: Burman Nieves, M.D.    Abd 1 View (kub)  10/18/2012  *RADIOLOGY REPORT*  Clinical Data: 52 year old female with abdominal pain and vomiting.  ABDOMEN - 1 VIEW  Comparison: None  Findings: The bowel gas pattern is unremarkable with stool and gas in the colon. No dilated bowel loops are present. Cholecystectomy clips are identified. No suspicious calcifications are present. The bony abnormalities are identified.  IMPRESSION: Unremarkable bowel gas pattern.  No evidence of acute abnormality.   Original Report Authenticated By: Harmon Pier, M.D.     Scheduled Meds: . aspirin EC  325 mg Stacy Daily  . docusate sodium  100 mg Stacy BID  . heparin  5,000 Units Subcutaneous Q8H  . polyethylene glycol  17 g Stacy BID  . sodium chloride  3 mL Intravenous Q12H   Continuous Infusions:   Principal Problem:   Chest pain Active Problems:   Syncope   Transaminitis   Elevated CK   Unspecified constipation   Grief reaction   Stacy Bowman M. Elvera Lennox, MD [facility] [phone] If 7PM-7AM, please contact night-coverage at [email or web address], password [facility] 10/19/2012, 3:13 PM  LOS: 2 days

## 2012-10-20 ENCOUNTER — Inpatient Hospital Stay (HOSPITAL_COMMUNITY): Payer: Medicare Other

## 2012-10-20 ENCOUNTER — Observation Stay (HOSPITAL_COMMUNITY): Payer: Medicare Other

## 2012-10-20 DIAGNOSIS — R4189 Other symptoms and signs involving cognitive functions and awareness: Secondary | ICD-10-CM | POA: Diagnosis not present

## 2012-10-20 DIAGNOSIS — R404 Transient alteration of awareness: Secondary | ICD-10-CM | POA: Diagnosis not present

## 2012-10-20 DIAGNOSIS — F4321 Adjustment disorder with depressed mood: Secondary | ICD-10-CM

## 2012-10-20 LAB — BASIC METABOLIC PANEL
BUN: 12 mg/dL (ref 6–23)
CO2: 26 mEq/L (ref 19–32)
Calcium: 9.9 mg/dL (ref 8.4–10.5)
Chloride: 100 mEq/L (ref 96–112)
Creatinine, Ser: 0.68 mg/dL (ref 0.50–1.10)
Glucose, Bld: 108 mg/dL — ABNORMAL HIGH (ref 70–99)

## 2012-10-20 LAB — HEPATIC FUNCTION PANEL
AST: 66 U/L — ABNORMAL HIGH (ref 0–37)
Albumin: 3.8 g/dL (ref 3.5–5.2)
Total Bilirubin: 0.2 mg/dL — ABNORMAL LOW (ref 0.3–1.2)
Total Protein: 8.3 g/dL (ref 6.0–8.3)

## 2012-10-20 LAB — CBC
HCT: 42 % (ref 36.0–46.0)
Hemoglobin: 14.9 g/dL (ref 12.0–15.0)
MCH: 30.5 pg (ref 26.0–34.0)
MCV: 85.9 fL (ref 78.0–100.0)
RBC: 4.89 MIL/uL (ref 3.87–5.11)

## 2012-10-20 NOTE — Procedures (Signed)
EEG report.  Brief clinical history:  52 years old female who sustained an isolated episode of brief alteration of consciousness. Differential diagnosis is syncope versus seizures. No prior history of frank epileptic seizures.  Technique: this is a 17 channel routine scalp EEG performed at the bedside with bipolar and monopolar montages arranged in accordance to the international 10/20 system of electrode placement. One channel was dedicated to EKG recording.  No drowsiness or sleep achieved during the test. Hyperventilation and intermittent photic stimulation were utilized as activating procedures.  Description:In the wakeful state, the best background consisted of a medium amplitude, posterior dominant, well sustained, symmetric and reactive 11 Hz rhythm. Hyperventilation did not induce physiologic slowing or epileptiform discharges. Intermittent photic stimulation did induce a normal driving response.  No focal or generalized epileptiform discharges noted.  No pathologic areas of slowing seen.  EKG showed sinus rhythm.  Impression: this is a normal awake EEG. Please, be aware that a normal EEG does not exclude the possibility of epilepsy.  Clinical correlation is advised.  Wyatt Portela, MD

## 2012-10-20 NOTE — Progress Notes (Signed)
EEG completed at bedside 

## 2012-10-20 NOTE — Consult Note (Signed)
Referring Physician: Dr. Mayford Knife.    Chief Complaint: unresponsiveness. Code stroke.  HPI:                                                                                                                                         Stacy Bowman is an 52 y.o. female with a past medical history significant for anxiety, admitted to the hospital due to atypical chest pain, who was doing well, eating breakfast and watching TV when suddenly became unresponsive to verbal commands and sternal rub. There is no report of focal neurological findings upon initial evaluation by the rapid response nurse and her BP was 160/90. Blood sugar 119. When I got to her bedside, she was alert, awake, and oriented with no lateralizing neurological signs and complaining of feeling tired. Urgent CT brain showed no acute intracranial abnormality consistent with ischemia or hemorrhage.   LSN: around 8:45 am. tPA Given: no, patient back to baseline with NIHSS 0.  Past Medical History  Diagnosis Date  . Anxiety   . Constipation     Past Surgical History  Procedure Laterality Date  . Tubal ligation      History reviewed. No pertinent family history. Social History:  reports that she has never smoked. She does not have any smokeless tobacco history on file. She reports that she does not drink alcohol or use illicit drugs.  Allergies: No Known Allergies  Medications:                                                                                                                           I have reviewed the patient's current medications.  ROS:  History obtained from chart review  General ROS: negative for - chills, fatigue, fever, night sweats, weight gain or weight loss Psychological ROS: negative for - behavioral disorder, hallucinations, memory difficulties, mood swings or suicidal  ideation Ophthalmic ROS: negative for - blurry vision, double vision, eye pain or loss of vision ENT ROS: negative for - epistaxis, nasal discharge, oral lesions, sore throat, tinnitus or vertigo Allergy and Immunology ROS: negative for - hives or itchy/watery eyes Hematological and Lymphatic ROS: negative for - bleeding problems, bruising or swollen lymph nodes Endocrine ROS: negative for - galactorrhea, hair pattern changes, polydipsia/polyuria or temperature intolerance Respiratory ROS: negative for - cough, hemoptysis, shortness of breath or wheezing Cardiovascular ROS: significant forchest pain. Gastrointestinal ROS: significant for mild abdominal pain.  Genito-Urinary ROS: negative for - dysuria, hematuria, incontinence or urinary frequency/urgency Musculoskeletal ROS: negative for - joint swelling or muscular weakness Neurological ROS: as noted in HPI Dermatological ROS: negative for rash and skin lesion changes     Physical exam: pleasant female in no apparent distress.Blood pressure 149/84, pulse 84, temperature 98.4 F (36.9 C), temperature source Oral, resp. rate 12, height 5\' 4"  (1.626 m), weight 81.6 kg (179 lb 14.3 oz), SpO2 98.00%. Head: normocephalic. Neck: supple, no bruits, no JVD. Cardiac: no murmurs. Lungs: clear. Abdomen: soft, no tender, no mass. Extremities: no edema.   Neurologic Examination:                                                                                                      Mental Status: Alert, oriented, thought content appropriate.  Speech fluent without evidence of aphasia.  Able to follow 3 step commands without difficulty. Cranial Nerves: II: Discs flat bilaterally; Visual fields grossly normal, pupils equal, round, reactive to light and accommodation III,IV, VI: ptosis not present, extra-ocular motions intact bilaterally V,VII: smile symmetric, facial light touch sensation normal bilaterally VIII: hearing normal bilaterally IX,X: gag  reflex present XI: bilateral shoulder shrug XII: midline tongue extension Motor: Right : Upper extremity   5/5    Left:     Upper extremity   5/5  Lower extremity   5/5     Lower extremity   5/5 Tone and bulk:normal tone throughout; no atrophy noted Sensory: Pinprick and light touch intact throughout, bilaterally Deep Tendon Reflexes: 2+ and symmetric throughout Plantars: Right: downgoing   Left: downgoing Cerebellar: normal finger-to-nose,  normal heel-to-shin test Gait: no ataxia. CV: pulses palpable throughout       Results for orders placed during the hospital encounter of 10/17/12 (from the past 48 hour(s))  TROPONIN I     Status: None   Collection Time    10/18/12 12:38 PM      Result Value Range   Troponin I <0.30  <0.30 ng/mL   Comment:            Due to the release kinetics of cTnI,     a negative result within the first hours     of the onset of symptoms does not rule out     myocardial infarction with certainty.  If myocardial infarction is still suspected,     repeat the test at appropriate intervals.  CK     Status: Abnormal   Collection Time    10/18/12 12:38 PM      Result Value Range   Total CK 260 (*) 7 - 177 U/L  HEPATIC FUNCTION PANEL     Status: Abnormal   Collection Time    10/19/12  3:29 PM      Result Value Range   Total Protein 8.6 (*) 6.0 - 8.3 g/dL   Albumin 3.9  3.5 - 5.2 g/dL   AST 83 (*) 0 - 37 U/L   ALT 114 (*) 0 - 35 U/L   Alkaline Phosphatase 93  39 - 117 U/L   Total Bilirubin 0.2 (*) 0.3 - 1.2 mg/dL   Bilirubin, Direct <1.6  0.0 - 0.3 mg/dL   Indirect Bilirubin NOT CALCULATED  0.3 - 0.9 mg/dL  HEPATIC FUNCTION PANEL     Status: Abnormal   Collection Time    10/20/12  6:55 AM      Result Value Range   Total Protein 8.3  6.0 - 8.3 g/dL   Albumin 3.8  3.5 - 5.2 g/dL   AST 66 (*) 0 - 37 U/L   ALT 110 (*) 0 - 35 U/L   Alkaline Phosphatase 92  39 - 117 U/L   Total Bilirubin 0.2 (*) 0.3 - 1.2 mg/dL   Bilirubin, Direct <1.0  0.0  - 0.3 mg/dL   Indirect Bilirubin NOT CALCULATED  0.3 - 0.9 mg/dL  CBC     Status: None   Collection Time    10/20/12  6:55 AM      Result Value Range   WBC 8.1  4.0 - 10.5 K/uL   RBC 4.89  3.87 - 5.11 MIL/uL   Hemoglobin 14.9  12.0 - 15.0 g/dL   HCT 96.0  45.4 - 09.8 %   MCV 85.9  78.0 - 100.0 fL   MCH 30.5  26.0 - 34.0 pg   MCHC 35.5  30.0 - 36.0 g/dL   RDW 11.9  14.7 - 82.9 %   Platelets 259  150 - 400 K/uL  BASIC METABOLIC PANEL     Status: Abnormal   Collection Time    10/20/12  6:55 AM      Result Value Range   Sodium 138  135 - 145 mEq/L   Potassium 4.1  3.5 - 5.1 mEq/L   Chloride 100  96 - 112 mEq/L   CO2 26  19 - 32 mEq/L   Glucose, Bld 108 (*) 70 - 99 mg/dL   BUN 12  6 - 23 mg/dL   Creatinine, Ser 5.62  0.50 - 1.10 mg/dL   Calcium 9.9  8.4 - 13.0 mg/dL   GFR calc non Af Amer >90  >90 mL/min   GFR calc Af Amer >90  >90 mL/min   Comment:            The eGFR has been calculated     using the CKD EPI equation.     This calculation has not been     validated in all clinical     situations.     eGFR's persistently     <90 mL/min signify     possible Chronic Kidney Disease.  GLUCOSE, CAPILLARY     Status: Abnormal   Collection Time    10/20/12  9:00 AM      Result Value Range  Glucose-Capillary 183 (*) 70 - 99 mg/dL   Comment 1 Documented in Chart     Comment 2 Notify RN     Nm Myocar Multi W/spect W/wall Motion / Ef  10/19/2012  *RADIOLOGY REPORT*  Clinical Data:  Chest pain  MYOCARDIAL IMAGING WITH SPECT (REST AND PHARMACOLOGIC-STRESS) GATED LEFT VENTRICULAR WALL MOTION STUDY LEFT VENTRICULAR EJECTION FRACTION  Technique:  Standard myocardial SPECT imaging was performed after resting intravenous injection of 10 mCi Tc-13m sestamibi. Subsequently, intravenous infusion of Lexiscan was performed under the supervision of the Cardiology staff.  At peak effect of the drug, 30 mCi Tc-30m sestamibi was injected intravenously and standard myocardial SPECT  imaging was  performed.  Quantitative gated imaging was also performed to evaluate left ventricular wall motion, and estimate left ventricular ejection fraction.  Comparison:  None  Findings: SPECT imaging demonstrates slight area of apparent reversibility within the anterior wall and apex.  This does not to quantify as significant by QPS software (7% reversibility).  No fixed defect to suggest infarct.  Quantitative gated analysis shows normal wall motion.  The resting left ventricular ejection fraction is 47% with end- diastolic volume of 53 ml and end-systolic volume of 33 ml.  IMPRESSION: Subtle area of reversibility in the anterior wall and apex.  This does not quantify significant by QPS software.  Recommend correlation with clinical symptoms and EKG changes to exclude ischemia.  Ejection fraction 47%.   Original Report Authenticated By: Charlett Nose, M.D.       Assessment: 51 y.o. female with acute onset transient unresponsiveness. Her neuro-exam is normal and CT brain showed no acute intracranial abnormality. She is metabolically intact and has no risk factors for epilepsy. Significant stress resulting from recent death of her sister. Will get EEG and see her after completion of the test.    Wyatt Portela, MD [facility].  10/20/2012 9:33 am.

## 2012-10-20 NOTE — Significant Event (Addendum)
Rapid Response Event Note  Overview:  Called to see patient who was found unresponsive Time Called: 0900 Arrival Time: 0903 Event Type: Neurologic  Initial Focused Assessment: On arrival patient supine in bed - skin warm and dry - not responding to voice - deep pinch to upper arms results in groan - eyes midline - pupil 3mm ERL - good muscle tone in all 4's - controls arms to prevent hitting face when lifted above head - no incontinence noted - RN Aundra Millet states patient was alert and eating breakfast about 15 minutes prior to event. - Dr. Mayford Knife walked in and patient was not arousable.  CBG 183.  BP 165/98 HR 101 RR 22 O2sats 100% on RA.    Interventions:  After few minutes of stimulation patient began to arouse =- opening eyes and eventually speaking - states she is not sure what happened.  Dr Cyril Mourning present - no focal deficits noted - patient now c/o slight headache. NIHSS 0.  To CT scan.  Patient tol well.  On return patient fully awake - no neuro deficits noted - denies headache.  BP 116/74  HR 84 SR RR 12 O2sats 99% - all baseline vital signs.  Care returned to [facility]  Call as needed.       Event Summary: Name of Physician Notified: Dr. Mayford Knife at  (on unit found patient unresponsive)  Name of Consulting Physician Notified: Dr. Davonna Belling at [phone]  Outcome: Stayed in room and stabalized  Event End Time: 0944  [place]

## 2012-10-20 NOTE — Progress Notes (Signed)
SUBJECTIVE:  Walked in to patient's room and patient completely unresponsive to verbal commands or deep sternal rub.    OBJECTIVE:   Vitals:   Filed Vitals:   10/19/12 1259 10/19/12 1440 10/19/12 2023 10/20/12 0524  BP: 113/77 117/75 119/70 122/72  Pulse: 118 94 96 79  Temp:  98.5 F (36.9 C) 98 F (36.7 C) 97.7 F (36.5 C)  TempSrc:  Oral Oral Oral  Resp:  18 18 18   Height:      Weight:    81.6 kg (179 lb 14.3 oz)  SpO2:  95% 99% 97%   I&O's:   Intake/Output Summary (Last 24 hours) at 10/20/12 0854 Last data filed at 10/19/12 1712  Gross per 24 hour  Intake    243 ml  Output      0 ml  Net    243 ml   TELEMETRY: Reviewed telemetry pt in NSR:     PHYSICAL EXAM General: Well developed, well nourished, in no acute distress Head: Eyes PERRLA, No xanthomas.   Normal cephalic and atramatic  Lungs:   Clear bilaterally to auscultation and percussion. Heart:   HRRR S1 S2 Pulses are 2+ & equal.  Abdomen: Bowel sounds are positive, abdomen soft and non-tender without masses Extremities:   No clubbing, cyanosis or edema.  DP +1 Neuro: unresponsive Psych:  Cannot assess   LABS: Basic Metabolic Panel:  Recent Labs  47/82/95 2248 10/18/12 0113 10/18/12 0719 10/20/12 0655  NA 138  --  138 138  K 4.1  --  4.0 4.1  CL 99  --  100 100  CO2 28  --  28 26  GLUCOSE 119*  --  105* 108*  BUN 8  --  10 12  CREATININE 0.79  --  0.72 0.68  CALCIUM 10.4  --  9.8 9.9  PHOS  --  4.2  --   --    Liver Function Tests:  Recent Labs  10/19/12 1529 10/20/12 0655  AST 83* 66*  ALT 114* 110*  ALKPHOS 93 92  BILITOT 0.2* 0.2*  PROT 8.6* 8.3  ALBUMIN 3.9 3.8   No results found for this basename: LIPASE, AMYLASE,  in the last 72 hours CBC:  Recent Labs  10/18/12 0719 10/20/12 0655  WBC 7.8 8.1  HGB 13.2 14.9  HCT 37.5 42.0  MCV 85.8 85.9  PLT 241 259   Cardiac Enzymes:  Recent Labs  10/18/12 0134 10/18/12 0718 10/18/12 1238  CKTOTAL  --   --  260*  TROPONINI  <0.30 <0.30 <0.30   BNP: No components found with this basename: POCBNP,  D-Dimer:  Recent Labs  10/17/12 2348  DDIMER <0.27   Hemoglobin A1C:  Recent Labs  10/18/12 0719  HGBA1C 5.9*   Fasting Lipid Panel: No results found for this basename: CHOL, HDL, LDLCALC, TRIG, CHOLHDL, LDLDIRECT,  in the last 72 hours Thyroid Function Tests:  Recent Labs  10/18/12 0113  TSH 2.867   Anemia Panel: No results found for this basename: VITAMINB12, FOLATE, FERRITIN, TIBC, IRON, RETICCTPCT,  in the last 72 hours Coag Panel:   No results found for this basename: INR, PROTIME    RADIOLOGY: Dg Chest 2 View  10/17/2012  *RADIOLOGY REPORT*  Clinical Data: Chest pain, shortness of breath, weakness, syncope 4 days ago.  CHEST - 2 VIEW  Comparison: None.  Findings: Shallow inspiration. The heart size and pulmonary vascularity are normal. The lungs appear clear and expanded without focal air space disease or consolidation. No  blunting of the costophrenic angles.  No pneumothorax.  Mediastinal contours appear intact.  IMPRESSION: No evidence of active pulmonary disease.   Original Report Authenticated By: Burman Nieves, M.D.    Abd 1 View (kub)  10/18/2012  *RADIOLOGY REPORT*  Clinical Data: 52 year old female with abdominal pain and vomiting.  ABDOMEN - 1 VIEW  Comparison: None  Findings: The bowel gas pattern is unremarkable with stool and gas in the colon. No dilated bowel loops are present. Cholecystectomy clips are identified. No suspicious calcifications are present. The bony abnormalities are identified.  IMPRESSION: Unremarkable bowel gas pattern.  No evidence of acute abnormality.   Original Report Authenticated By: Harmon Pier, M.D.    Nm Myocar Multi W/spect W/wall Motion / Ef  10/19/2012  *RADIOLOGY REPORT*  Clinical Data:  Chest pain  MYOCARDIAL IMAGING WITH SPECT (REST AND PHARMACOLOGIC-STRESS) GATED LEFT VENTRICULAR WALL MOTION STUDY LEFT VENTRICULAR EJECTION FRACTION  Technique:   Standard myocardial SPECT imaging was performed after resting intravenous injection of 10 mCi Tc-67m sestamibi. Subsequently, intravenous infusion of Lexiscan was performed under the supervision of the Cardiology staff.  At peak effect of the drug, 30 mCi Tc-48m sestamibi was injected intravenously and standard myocardial SPECT  imaging was performed.  Quantitative gated imaging was also performed to evaluate left ventricular wall motion, and estimate left ventricular ejection fraction.  Comparison:  None  Findings: SPECT imaging demonstrates slight area of apparent reversibility within the anterior wall and apex.  This does not to quantify as significant by QPS software (7% reversibility).  No fixed defect to suggest infarct.  Quantitative gated analysis shows normal wall motion.  The resting left ventricular ejection fraction is 47% with end- diastolic volume of 53 ml and end-systolic volume of 33 ml.  IMPRESSION: Subtle area of reversibility in the anterior wall and apex.  This does not quantify significant by QPS software.  Recommend correlation with clinical symptoms and EKG changes to exclude ischemia.  Ejection fraction 47%.   Original Report Authenticated By: Charlett Nose, M.D.    ASSESSMENT:  1. Atypical and typical chest pain with negative troponin x 3 and normal EKG. CRF include family history of CAD (mom with stents). Nuclear stress test revealed a subtle area of reversibility in the anterior wall and apex with mildly reduced LVF EF 47%. 2. Elevated CPK at outside hospital  3. Transaminitis of ? etiology  4.  Unresponsiveness with normal BS - ?CVA ? psychiatric PLAN:  1. Given symptoms of chest pain with any exertion and at rest which improve with NTG I have recommended that we proceed with Cardiac cath later today by Dr. Katrinka Blazing but given new neurological findings will have to cancel for now. 2.  Stat head CT 3.  Neuro consult - Rapid response at bedside  Quintella Reichert, MD  10/20/2012  8:54  AM

## 2012-10-20 NOTE — Progress Notes (Signed)
[facility] PROGRESS NOTE  Memorie Yokoyama YNW:[id number] DOB: 10/08/1960 DOA: 10/17/2012 PCP: [name], MD  Brief narrative: 52 y.o. year old female chest pain. Patient was for similar symptoms at [facility] over the weekend. Per patient and daughter patient had blood work as well as imaging done. Family is on sure results of imaging and tests. Patient was discharged yesterday without a formal diagnosis per report. Patient reports having central chest pain that radiates to the neck jaw/shoulder area with associated nausea and diaphoresis. Symptoms were similar over the weekend. Pain feels like someone sitting on her chest. Pain is sharp 9/10 at its worse. No alleviating or aggravating factors. Pain is not worsened with exertion. No past history of hypertension, diabetes. No family history of early heart attack. Patient also reports some intermittent dizziness with feeling of the room spinning going from sitting to standing. Patient also was having some left-sided weakness/numbness that has been going on the past 3 months. Primary care physician is in [place]. This has been discussed with primary care physician the past. Patient denies a formal workup. Symptoms have been fairly stable with no acute worsening today. Patient also for some mild abdominal pain. Nonspecific without radiation. Patient does have to strain have a bowel movement.  Assessment/Plan: 1. Chest pain: with typical and atypical features (left arm and neck pain, diaphoresis, SOB). EKG sinus rhythm, no acute changes. Cardiac enzymes negative thus far. Stress test shows potential reversibility area in the anterior wall. Plan for cath likely tomorrow.  2. Syncope, dizziness?: Etiology and significance unclear. Another ?syncopal episode this morning, VSS. Neurology consulted. CT scan head negative for acute processes. EEG pending.  3. Transaminitis: Etiology and significance unclear. Not on a statin. Repeat study  shows improvement.  4. Elevated CK: Seen at [facility]. 5. Fatigue: TSH normal. Suspect secondary to stress, acute grief reaction. 6. Abdominal pain, constipation: Bowel regimen.  7. Grief, situational stress: Recently buried her sister.  Code Status: full code Family Communication: none present Disposition Plan: cath pending.   Consultants:  [facility] cardiology  Procedures:  Stress test 10/19/2012  Subtle area of reversibility in the anterior wall and apex. This  does not quantify significant by QPS software. Recommend  correlation with clinical symptoms and EKG changes to exclude  ischemia.    HPI/Subjective: - sudden unresponsiveness this morning, patient not responsive to sternal rub. Met call initiated, Neuro consulted. VSS throughout this. Patient slowly returned to normal. Daughter endorses these spells have happened at home, most recently 2 days after her sister's funeral.   Objective: Filed Vitals:   10/20/12 0524 10/20/12 0901 10/20/12 0919 10/20/12 1442  BP: 122/72 165/98 149/84 123/81  Pulse: 79 101 84 96  Temp: 97.7 F (36.5 C) 98.4 F (36.9 C)  97.9 F (36.6 C)  TempSrc: Oral Oral  Oral  Resp: 18 22 12 16   Height:      Weight: 81.6 kg (179 lb 14.3 oz)     SpO2: 97% 100% 98% 99%    Intake/Output Summary (Last 24 hours) at 10/20/12 1501 Last data filed at 10/19/12 1712  Gross per 24 hour  Intake    243 ml  Output      0 ml  Net    243 ml   Filed Weights   10/18/12 0205 10/19/12 0500 10/20/12 0524  Weight: 81.4 kg (179 lb 7.3 oz) 80.9 kg (178 lb 5.6 oz) 81.6 kg (179 lb 14.3 oz)    Exam: General:  Appears calm and comfortable. A  bit more interactive today compared to yesterday.  Cardiovascular: RRR, no m/r/g. No LE edema. Telemetry: SR, no arrhythmias  Respiratory: CTA bilaterally, no w/r/r. Normal respiratory effort. Abdomen: soft, ntnd Psychiatric: Appears depressed. Flat affect. Speech fluent and clear.  Data Reviewed: Basic Metabolic  Panel:  Recent Labs Lab 10/17/12 2248 10/18/12 0113 10/18/12 0719 10/20/12 0655  NA 138  --  138 138  K 4.1  --  4.0 4.1  CL 99  --  100 100  CO2 28  --  28 26  GLUCOSE 119*  --  105* 108*  BUN 8  --  10 12  CREATININE 0.79  --  0.72 0.68  CALCIUM 10.4  --  9.8 9.9  PHOS  --  4.2  --   --    Liver Function Tests:  Recent Labs Lab 10/18/12 0719 10/19/12 1529 10/20/12 0655  AST 106* 83* 66*  ALT 106* 114* 110*  ALKPHOS 86 93 92  BILITOT 0.2* 0.2* 0.2*  PROT 7.9 8.6* 8.3  ALBUMIN 3.7 3.9 3.8   CBC:  Recent Labs Lab 10/17/12 2248 10/18/12 0719 10/20/12 0655  WBC 9.6 7.8 8.1  HGB 14.2 13.2 14.9  HCT 40.9 37.5 42.0  MCV 85.7 85.8 85.9  PLT 272 241 259   Cardiac Enzymes:  Recent Labs Lab 10/18/12 0134 10/18/12 0718 10/18/12 1238  CKTOTAL  --   --  260*  TROPONINI <0.30 <0.30 <0.30   Studies: Ct Head Wo Contrast  10/20/2012  *RADIOLOGY REPORT*  Clinical Data: Unresponsive.  Code stroke.  CT HEAD WITHOUT CONTRAST  Technique:  Contiguous axial images were obtained from the base of the skull through the vertex without contrast.  Comparison: No priors.  Findings: A small well defined focal area of decreased attenuation in the white matter immediately lateral to the head of the right caudate nucleus, most compatible with an old lacunar infarction. No acute intracranial abnormalities.  Specifically, no evidence of acute intracranial hemorrhage, no definite findings of acute/subacute cerebral ischemia, no mass, mass effect, hydrocephalus or abnormal intra or extra-axial fluid collections. Visualized paranasal sinuses and mastoids are well pneumatized.  No acute displaced skull fractures are identified.  IMPRESSION: 1.  No acute intracranial abnormality. 2.  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  These results were called by telephone on 10/20/2012 at 09:40 a.m. to Dr., Joselyn Glassman, who verbally  acknowledged these results.   Original Report Authenticated By: Trudie Reed, M.D.    Nm Myocar Multi W/spect W/wall Motion / Ef  10/19/2012  *RADIOLOGY REPORT*  Clinical Data:  Chest pain  MYOCARDIAL IMAGING WITH SPECT (REST AND PHARMACOLOGIC-STRESS) GATED LEFT VENTRICULAR WALL MOTION STUDY LEFT VENTRICULAR EJECTION FRACTION  Technique:  Standard myocardial SPECT imaging was performed after resting intravenous injection of 10 mCi Tc-31m sestamibi. Subsequently, intravenous infusion of Lexiscan was performed under the supervision of the Cardiology staff.  At peak effect of the drug, 30 mCi Tc-12m sestamibi was injected intravenously and standard myocardial SPECT  imaging was performed.  Quantitative gated imaging was also performed to evaluate left ventricular wall motion, and estimate left ventricular ejection fraction.  Comparison:  None  Findings: SPECT imaging demonstrates slight area of apparent reversibility within the anterior wall and apex.  This does not to quantify as significant by QPS software (7% reversibility).  No fixed defect to suggest infarct.  Quantitative gated analysis shows normal wall motion.  The resting left ventricular ejection fraction is 47%  with end- diastolic volume of 53 ml and end-systolic volume of 33 ml.  IMPRESSION: Subtle area of reversibility in the anterior wall and apex.  This does not quantify significant by QPS software.  Recommend correlation with clinical symptoms and EKG changes to exclude ischemia.  Ejection fraction 47%.   Original Report Authenticated By: Charlett Nose, M.D.     Scheduled Meds: . aspirin EC  325 mg Oral Daily  . docusate sodium  100 mg Oral BID  . heparin  5,000 Units Subcutaneous Q8H  . polyethylene glycol  17 g Oral BID  . sodium chloride  3 mL Intravenous Q12H   Continuous Infusions:   Principal Problem:   Chest pain Active Problems:   Syncope   Transaminitis   Elevated CK   Unspecified constipation   Grief reaction    Unresponsive episode   Costin M. Elvera Lennox, MD [facility] [phone] If 7PM-7AM, please contact night-coverage at [email or web address], password [facility] 10/20/2012, 3:01 PM  LOS: 3 days

## 2012-10-20 NOTE — Plan of Care (Signed)
Problem: Phase III Progression Outcomes Goal: Cath/PCI Path as indicated Outcome: Not Progressing Cardiac cath placed on hold for now d/t unresponsive episode today.

## 2012-10-20 NOTE — Progress Notes (Signed)
Dr Mayford Knife came to desk at 0900 to make RN aware of pt sudden unresponsiveness.  RN on phone with daughter at the time who stated her mother wasn't answering phone and "to check on her to make sure she wasn't unresponsive".  Pt minimally responsive to sternal rub and painful stimuli.  Moans and some eye movement.  Pupils equal round and reactive to light.  Vitals stable, BP slightly elevated 160/90's, respiratory rate unlabored and 16 resp/min.  CBG 183.  Rapid response and attending physicians paged to bedside.  STAT CT ordered.  Will continue to monitor closely. Ave Filter

## 2012-10-21 ENCOUNTER — Encounter (HOSPITAL_COMMUNITY)
Admission: EM | Disposition: A | Discharge status: Home / Self Care | Payer: Self-pay | Source: Home / Self Care | Attending: Internal Medicine

## 2012-10-21 DIAGNOSIS — R9439 Abnormal result of other cardiovascular function study: Secondary | ICD-10-CM

## 2012-10-21 HISTORY — PX: LEFT HEART CATHETERIZATION WITH CORONARY ANGIOGRAM: SHX5451

## 2012-10-21 LAB — PROTIME-INR: INR: 0.87 (ref 0.00–1.49)

## 2012-10-21 LAB — CBC
HCT: 41.9 % (ref 36.0–46.0)
MCV: 86.4 fL (ref 78.0–100.0)
Platelets: 229 10*3/uL (ref 150–400)
RBC: 4.85 MIL/uL (ref 3.87–5.11)
RDW: 12.3 % (ref 11.5–15.5)
WBC: 7.7 10*3/uL (ref 4.0–10.5)

## 2012-10-21 LAB — BASIC METABOLIC PANEL
BUN: 9 mg/dL (ref 6–23)
Chloride: 100 mEq/L (ref 96–112)
Glucose, Bld: 90 mg/dL (ref 70–99)
Potassium: 4 mEq/L (ref 3.5–5.1)
Sodium: 137 mEq/L (ref 135–145)

## 2012-10-21 SURGERY — LEFT HEART CATHETERIZATION WITH CORONARY ANGIOGRAM
Anesthesia: LOCAL

## 2012-10-21 MED ORDER — ONDANSETRON HCL 4 MG/2ML IJ SOLN: 4.0000 mg | Freq: Four times a day (QID) | INTRAMUSCULAR | Status: DC | PRN

## 2012-10-21 MED ORDER — ACETAMINOPHEN 325 MG PO TABS: 650.0000 mg | ORAL_TABLET | ORAL | Status: DC | PRN

## 2012-10-21 MED ORDER — SUMATRIPTAN SUCCINATE 50 MG PO TABS
50.0000 mg | ORAL_TABLET | ORAL | Status: DC | PRN
  Filled 2012-10-21: qty 1

## 2012-10-21 MED ORDER — ASPIRIN 81 MG PO CHEW
324.0000 mg | CHEWABLE_TABLET | ORAL | Status: AC
  Administered 2012-10-21: 324 mg via ORAL
  Filled 2012-10-21: qty 4

## 2012-10-21 MED ORDER — SODIUM CHLORIDE 0.9 % IJ SOLN: 3.0000 mL | INTRAMUSCULAR | Status: DC | PRN

## 2012-10-21 MED ORDER — HEPARIN SODIUM (PORCINE) 5000 UNIT/ML IJ SOLN: 5000.0000 [IU] | Freq: Three times a day (TID) | INTRAMUSCULAR | Status: DC

## 2012-10-21 MED ORDER — SODIUM CHLORIDE 0.9 % IV SOLN
1.0000 mL/kg/h | INTRAVENOUS | Status: AC
Start: 2012-10-21 — End: 2012-10-21

## 2012-10-21 MED ORDER — VENLAFAXINE HCL ER 75 MG PO CP24
75.0000 mg | ORAL_CAPSULE | Freq: Every day | ORAL | Status: DC
  Administered 2012-10-21 – 2012-10-22 (×2): 75 mg via ORAL
  Filled 2012-10-21 (×2): qty 1

## 2012-10-21 MED ORDER — HEPARIN (PORCINE) IN NACL 2-0.9 UNIT/ML-% IJ SOLN
INTRAMUSCULAR | Status: AC
  Filled 2012-10-21: qty 1000

## 2012-10-21 MED ORDER — POLYETHYLENE GLYCOL 3350 17 G PO PACK
17.0000 g | PACK | Freq: Every day | ORAL | Status: DC
  Administered 2012-10-22: 17 g via ORAL
  Filled 2012-10-21 (×2): qty 1

## 2012-10-21 MED ORDER — SODIUM CHLORIDE 0.9 % IV SOLN: 250.0000 mL | INTRAVENOUS | Status: DC | PRN

## 2012-10-21 MED ORDER — FENTANYL CITRATE 0.05 MG/ML IJ SOLN
INTRAMUSCULAR | Status: AC
  Filled 2012-10-21: qty 2

## 2012-10-21 MED ORDER — MIDAZOLAM HCL 2 MG/2ML IJ SOLN
INTRAMUSCULAR | Status: AC
  Filled 2012-10-21: qty 2

## 2012-10-21 MED ORDER — SODIUM CHLORIDE 0.9 % IJ SOLN: 3.0000 mL | Freq: Two times a day (BID) | INTRAMUSCULAR | Status: DC

## 2012-10-21 MED ORDER — SODIUM CHLORIDE 0.9 % IV SOLN
INTRAVENOUS | Status: DC
  Administered 2012-10-21: 10:00:00 via INTRAVENOUS

## 2012-10-21 MED ORDER — CEFAZOLIN SODIUM 1-5 GM-% IV SOLN
INTRAVENOUS | Status: AC
  Filled 2012-10-21: qty 50

## 2012-10-21 MED ORDER — HEPARIN SODIUM (PORCINE) 5000 UNIT/ML IJ SOLN
5000.0000 [IU] | Freq: Three times a day (TID) | INTRAMUSCULAR | Status: DC
  Administered 2012-10-21 – 2012-10-22 (×2): 5000 [IU] via SUBCUTANEOUS
  Filled 2012-10-21 (×5): qty 1

## 2012-10-21 MED ORDER — LIDOCAINE HCL (PF) 1 % IJ SOLN
INTRAMUSCULAR | Status: AC
  Filled 2012-10-21: qty 30

## 2012-10-21 NOTE — Progress Notes (Signed)
[facility] PROGRESS NOTE  Stacy Bowman ZHY:[id number] DOB: 31-Aug-1960 DOA: 10/17/2012 PCP: [name], MD  Brief narrative: 52 y.o. year old female chest pain. Patient was for similar symptoms at [facility] over the weekend. Per patient and daughter patient had blood work as well as imaging done. Family is on sure results of imaging and tests. Patient was discharged yesterday without a formal diagnosis per report. Patient reports having central chest pain that radiates to the neck jaw/shoulder area with associated nausea and diaphoresis. Symptoms were similar over the weekend. Pain feels like someone sitting on her chest. Pain is sharp 9/10 at its worse. No alleviating or aggravating factors. Pain is not worsened with exertion. No past history of hypertension, diabetes. No family history of early heart attack. Patient also reports some intermittent dizziness with feeling of the room spinning going from sitting to standing. Patient also was having some left-sided weakness/numbness that has been going on the past 3 months. Primary care physician is in [place]. This has been discussed with primary care physician the past. Patient denies a formal workup. Symptoms have been fairly stable with no acute worsening today. Patient also for some mild abdominal pain. Nonspecific without radiation. Patient does have to strain have a bowel movement.  Assessment/Plan: 1. Chest pain: with typical and atypical features (left arm and neck pain, diaphoresis, SOB). EKG sinus rhythm, no acute changes. Cardiac enzymes negative thus far. Stress test shows potential reversibility area in the anterior wall. Plan for cath today. No chest pain this morning. 2. Syncope, dizziness?: Etiology and significance unclear. Another ?syncopal episode on 10/20/12, VSS. Neurology consulted and CT head negative, EEG negative.  3. Transaminitis: Etiology and significance unclear. Not on a statin. Repeat study shows  improvement.  4. Elevated CK: Seen at [facility]. 5. Fatigue: TSH normal. Suspect secondary to stress, acute grief reaction. 6. Abdominal pain, constipation: Bowel regimen.  7. Grief, situational stress: Recently buried her sister.  Code Status: full code Family Communication: none present Disposition Plan: cath pending today.  Consultants:  [facility] cardiology  Procedures:  Cardiac cath 10/21/2012 - pending  Stress test 10/19/2012  Subtle area of reversibility in the anterior wall and apex. This  does not quantify significant by QPS software. Recommend  correlation with clinical symptoms and EKG changes to exclude  ischemia.   HPI/Subjective: - no complaints this morning, she is sitting comfortable in the bed and working on her laptop  Objective: Filed Vitals:   10/20/12 1442 10/20/12 2023 10/21/12 0500 10/21/12 0655  BP: 123/81 120/89  109/72  Pulse: 96 100  88  Temp: 97.9 F (36.6 C) 97.6 F (36.4 C)  97.9 F (36.6 C)  TempSrc: Oral Oral  Oral  Resp: 16 18  18   Height:      Weight:   81.4 kg (179 lb 7.3 oz)   SpO2: 99% 98%  98%    Intake/Output Summary (Last 24 hours) at 10/21/12 0942 Last data filed at 10/20/12 1600  Gross per 24 hour  Intake    480 ml  Output      0 ml  Net    480 ml   Filed Weights   10/19/12 0500 10/20/12 0524 10/21/12 0500  Weight: 80.9 kg (178 lb 5.6 oz) 81.6 kg (179 lb 14.3 oz) 81.4 kg (179 lb 7.3 oz)    Exam: General:  Appears calm and comfortable and in no acute distress Cardiovascular: RRR, no m/r/g. No LE edema. Telemetry: SR, no arrhythmias  Respiratory: CTA bilaterally,  no w/r/r. Normal respiratory effort. Abdomen: soft, ntnd Psychiatric: Appears depressed. Flat affect. Speech fluent and clear.  Data Reviewed: Basic Metabolic Panel:  Recent Labs Lab 10/17/12 2248 10/18/12 0113 10/18/12 0719 10/20/12 0655  NA 138  --  138 138  K 4.1  --  4.0 4.1  CL 99  --  100 100  CO2 28  --  28 26  GLUCOSE 119*  --   105* 108*  BUN 8  --  10 12  CREATININE 0.79  --  0.72 0.68  CALCIUM 10.4  --  9.8 9.9  PHOS  --  4.2  --   --    Liver Function Tests:  Recent Labs Lab 10/18/12 0719 10/19/12 1529 10/20/12 0655  AST 106* 83* 66*  ALT 106* 114* 110*  ALKPHOS 86 93 92  BILITOT 0.2* 0.2* 0.2*  PROT 7.9 8.6* 8.3  ALBUMIN 3.7 3.9 3.8   CBC:  Recent Labs Lab 10/17/12 2248 10/18/12 0719 10/20/12 0655  WBC 9.6 7.8 8.1  HGB 14.2 13.2 14.9  HCT 40.9 37.5 42.0  MCV 85.7 85.8 85.9  PLT 272 241 259   Cardiac Enzymes:  Recent Labs Lab 10/18/12 0134 10/18/12 0718 10/18/12 1238  CKTOTAL  --   --  260*  TROPONINI <0.30 <0.30 <0.30   Studies: Ct Head Wo Contrast  10/20/2012  *RADIOLOGY REPORT*  Clinical Data: Unresponsive.  Code stroke.  CT HEAD WITHOUT CONTRAST  Technique:  Contiguous axial images were obtained from the base of the skull through the vertex without contrast.  Comparison: No priors.  Findings: A small well defined focal area of decreased attenuation in the white matter immediately lateral to the head of the right caudate nucleus, most compatible with an old lacunar infarction. No acute intracranial abnormalities.  Specifically, no evidence of acute intracranial hemorrhage, no definite findings of acute/subacute cerebral ischemia, no mass, mass effect, hydrocephalus or abnormal intra or extra-axial fluid collections. Visualized paranasal sinuses and mastoids are well pneumatized.  No acute displaced skull fractures are identified.  IMPRESSION: 1.  No acute intracranial abnormality. 2.  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  These results were called by telephone on 10/20/2012 at 09:40 a.m. to Dr., Joselyn Glassman, who verbally acknowledged these results.   Original Report Authenticated By: Trudie Reed, M.D.    Nm Myocar Multi W/spect W/wall Motion / Ef  10/19/2012  *RADIOLOGY REPORT*  Clinical Data:  Chest pain   MYOCARDIAL IMAGING WITH SPECT (REST AND PHARMACOLOGIC-STRESS) GATED LEFT VENTRICULAR WALL MOTION STUDY LEFT VENTRICULAR EJECTION FRACTION  Technique:  Standard myocardial SPECT imaging was performed after resting intravenous injection of 10 mCi Tc-108m sestamibi. Subsequently, intravenous infusion of Lexiscan was performed under the supervision of the Cardiology staff.  At peak effect of the drug, 30 mCi Tc-70m sestamibi was injected intravenously and standard myocardial SPECT  imaging was performed.  Quantitative gated imaging was also performed to evaluate left ventricular wall motion, and estimate left ventricular ejection fraction.  Comparison:  None  Findings: SPECT imaging demonstrates slight area of apparent reversibility within the anterior wall and apex.  This does not to quantify as significant by QPS software (7% reversibility).  No fixed defect to suggest infarct.  Quantitative gated analysis shows normal wall motion.  The resting left ventricular ejection fraction is 47% with end- diastolic volume of 53 ml and end-systolic volume of 33 ml.  IMPRESSION: Subtle area of reversibility in the anterior wall  and apex.  This does not quantify significant by QPS software.  Recommend correlation with clinical symptoms and EKG changes to exclude ischemia.  Ejection fraction 47%.   Original Report Authenticated By: Charlett Nose, M.D.     Scheduled Meds: . aspirin EC  325 mg Oral Daily  . docusate sodium  100 mg Oral BID  . heparin  5,000 Units Subcutaneous Q8H  . polyethylene glycol  17 g Oral BID  . sodium chloride  3 mL Intravenous Q12H   Continuous Infusions: . sodium chloride 75 mL/hr at 10/21/12 1610    Principal Problem:   Chest pain Active Problems:   Syncope   Transaminitis   Elevated CK   Unspecified constipation   Grief reaction   Unresponsive episode   Abnormal cardiovascular stress test   Seymour Pavlak M. Elvera Lennox, MD [facility] [phone] If 7PM-7AM, please contact  night-coverage at [email or web address], password [facility] 10/21/2012, 9:42 AM  LOS: 4 days

## 2012-10-21 NOTE — CV Procedure (Signed)
PROCEDURE:  Left heart catheterization with selective coronary angiography, left ventriculogram.  Abdominal aortogram.  INDICATIONS:  Abnormal stress test.  The risks, benefits, and details of the procedure were explained to the patient.  The patient verbalized understanding and wanted to proceed.  Informed written consent was obtained.  PROCEDURE TECHNIQUE:  After Xylocaine anesthesia a 56F sheath was placed in the right femoral artery with a single anterior needle wall stick.   Left coronary angiography was done using a Judkins L4 guide catheter.  Right coronary angiography was done using a Judkins R4 guide catheter.  Left ventriculography was done using a pigtail catheter.    CONTRAST:  Total of 100 cc.  COMPLICATIONS:  None.    HEMODYNAMICS:  Aortic pressure was 119/78; LV pressure was 120/7; LVEDP 14.  There was no gradient between the left ventricle and aorta.    ANGIOGRAPHIC DATA:   The left main coronary artery is patent but smaller than expected.  The 5 Fr catheter damps.  There is no visible atherosclerosis or staining.  The left anterior descending artery is a large vessel which reaches the apex.  It appears angiographically normal.  There are two medium sized diagonals which appear widely patent.  The third diagonal is small and patent.  The left circumflex artery is a medium sized vessel which is angiographically normal.  OM1 is medium sized and widely patent.  The right coronary artery is a large dominant vessel which appears angiographically normal.  LEFT VENTRICULOGRAM:  Left ventricular angiogram was done in the 30 RAO projection and revealed normal left ventricular wall motion and systolic function with an estimated ejection fraction of 60%.  LVEDP was 14 mmHg.  ABDOMINAL AORTOGRAM: No AAA; no Renal artery stenosis.  Patent aortoiliac system.  IMPRESSIONS:  1. Small left main coronary artery, without evidence of atherosclerosis. 2. Normal left anterior descending  artery and its branches. 3. Normal left circumflex artery and its branches. 4. Normal right coronary artery. 5. Normal left ventricular systolic function.  LVEDP 14 mmHg.  Ejection fraction 60%.  RECOMMENDATION:  Preventive therapy.  Further workup of tachycardia.

## 2012-10-21 NOTE — Progress Notes (Signed)
Pt bed rest completed at 1900, up to bathroom with one assist, tolerated well, R groin site level 0, no hematoma/bruising/swelling noted.  Back to bed with call bell in reach and bed alarm activated.  Frequent vitals stable , printed, and placed in shadow chart.  Will continue to monitor closely. Ave Filter

## 2012-10-21 NOTE — Progress Notes (Signed)
NEURO HOSPITALIST PROGRESS NOTE   SUBJECTIVE:                                                                                                                        Offers no neurological complains. CT brain and EEG unremarkable.  OBJECTIVE:                                                                                                                           Vital signs in last 24 hours: Temp:  [97.6 F (36.4 C)-98.4 F (36.9 C)] 97.9 F (36.6 C) (02/28 0655) Pulse Rate:  [84-101] 88 (02/28 0655) Resp:  [12-22] 18 (02/28 0655) BP: (109-165)/(72-98) 109/72 mmHg (02/28 0655) SpO2:  [98 %-100 %] 98 % (02/28 0655) Weight:  [81.4 kg (179 lb 7.3 oz)] 81.4 kg (179 lb 7.3 oz) (02/28 0500)  Intake/Output from previous day: 02/27 0701 - 02/28 0700 In: 720 [P.O.:720] Out: -  Intake/Output this shift:   Nutritional status: Cardiac  Past Medical History  Diagnosis Date  . Anxiety   . Constipation     Neurologic ROS negative with exception of above.   Neurologic Exam:  Mental Status: Alert, oriented, thought content appropriate.  Speech fluent without evidence of aphasia.  Able to follow 3 step commands without difficulty. Cranial Nerves: II: Discs flat bilaterally; Visual fields grossly normal, pupils equal, round, reactive to light and accommodation III,IV, VI: ptosis not present, extra-ocular motions intact bilaterally V,VII: smile symmetric, facial light touch sensation normal bilaterally VIII: hearing normal bilaterally IX,X: gag reflex present XI: bilateral shoulder shrug XII: midline tongue extension Motor: Right : Upper extremity   5/5    Left:     Upper extremity   5/5  Lower extremity   5/5     Lower extremity   5/5 Tone and bulk:normal tone throughout; no atrophy noted Sensory: Pinprick and light touch intact throughout, bilaterally Deep Tendon Reflexes: 2+ and symmetric throughout Plantars: Right: downgoing   Left:  downgoing Cerebellar: normal finger-to-nose,  normal heel-to-shin test Gait: no ataxia. CV: pulses palpable throughout    Lab Results: No results found for this basename: cbc, bmp, coags, chol, tri, ldl, hga1c   Lipid Panel No results found for this basename: CHOL,  TRIG, HDL, CHOLHDL, VLDL, LDLCALC,  in the last 72 hours  Studies/Results: Ct Head Wo Contrast  10/20/2012  *RADIOLOGY REPORT*  Clinical Data: Unresponsive.  Code stroke.  CT HEAD WITHOUT CONTRAST  Technique:  Contiguous axial images were obtained from the base of the skull through the vertex without contrast.  Comparison: No priors.  Findings: A small well defined focal area of decreased attenuation in the white matter immediately lateral to the head of the right caudate nucleus, most compatible with an old lacunar infarction. No acute intracranial abnormalities.  Specifically, no evidence of acute intracranial hemorrhage, no definite findings of acute/subacute cerebral ischemia, no mass, mass effect, hydrocephalus or abnormal intra or extra-axial fluid collections. Visualized paranasal sinuses and mastoids are well pneumatized.  No acute displaced skull fractures are identified.  IMPRESSION: 1.  No acute intracranial abnormality. 2.  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  These results were called by telephone on 10/20/2012 at 09:40 a.m. to Dr., Joselyn Glassman, who verbally acknowledged these results.   Original Report Authenticated By: Trudie Reed, M.D.    Nm Myocar Multi W/spect W/wall Motion / Ef  10/19/2012  *RADIOLOGY REPORT*  Clinical Data:  Chest pain  MYOCARDIAL IMAGING WITH SPECT (REST AND PHARMACOLOGIC-STRESS) GATED LEFT VENTRICULAR WALL MOTION STUDY LEFT VENTRICULAR EJECTION FRACTION  Technique:  Standard myocardial SPECT imaging was performed after resting intravenous injection of 10 mCi Tc-43m sestamibi. Subsequently, intravenous infusion of Lexiscan  was performed under the supervision of the Cardiology staff.  At peak effect of the drug, 30 mCi Tc-85m sestamibi was injected intravenously and standard myocardial SPECT  imaging was performed.  Quantitative gated imaging was also performed to evaluate left ventricular wall motion, and estimate left ventricular ejection fraction.  Comparison:  None  Findings: SPECT imaging demonstrates slight area of apparent reversibility within the anterior wall and apex.  This does not to quantify as significant by QPS software (7% reversibility).  No fixed defect to suggest infarct.  Quantitative gated analysis shows normal wall motion.  The resting left ventricular ejection fraction is 47% with end- diastolic volume of 53 ml and end-systolic volume of 33 ml.  IMPRESSION: Subtle area of reversibility in the anterior wall and apex.  This does not quantify significant by QPS software.  Recommend correlation with clinical symptoms and EKG changes to exclude ischemia.  Ejection fraction 47%.   Original Report Authenticated By: Charlett Nose, M.D.     MEDICATIONS                                                                                                                       I have reviewed the patient's current medications.  ASSESSMENT/PLAN:  Transient unresponsiveness of unclear etiology but doubt a structural neurological cause. Her CT brain and EEG are unremarkable, and she is asymptomatic from a neurological standpoint. Neuro-exam is non focal. Will not pursue further neurological testing at this moment. Please, call neurology with any questions, concerns, or new neurological developments.   Wyatt Portela, MD [facility] [phone]  10/21/2012, 8:03 AM

## 2012-10-21 NOTE — Progress Notes (Signed)
SUBJECTIVE:  Feeling good today.  No complaints.  Neuro w/u negative  OBJECTIVE:   Vitals:   Filed Vitals:   10/20/12 1442 10/20/12 2023 10/21/12 0500 10/21/12 0655  BP: 123/81 120/89  109/72  Pulse: 96 100  88  Temp: 97.9 F (36.6 C) 97.6 F (36.4 C)  97.9 F (36.6 C)  TempSrc: Oral Oral  Oral  Resp: 16 18  18   Height:      Weight:   81.4 kg (179 lb 7.3 oz)   SpO2: 99% 98%  98%   I&O's:   Intake/Output Summary (Last 24 hours) at 10/21/12 0743 Last data filed at 10/20/12 1600  Gross per 24 hour  Intake    720 ml  Output      0 ml  Net    720 ml   TELEMETRY: Reviewed telemetry pt in NSR:     PHYSICAL EXAM General: Well developed, well nourished, in no acute distress Head: Eyes PERRLA, No xanthomas.   Normal cephalic and atramatic  Lungs:   Clear bilaterally to auscultation and percussion. Heart:   HRRR S1 S2 Pulses are 2+ & equal. Abdomen: Bowel sounds are positive, abdomen soft and non-tender without masses  Extremities:   No clubbing, cyanosis or edema.  DP +1 Neuro: Alert and oriented X 3. Psych:  Good affect, responds appropriately   LABS: Basic Metabolic Panel:  Recent Labs  40/98/11 0655  NA 138  K 4.1  CL 100  CO2 26  GLUCOSE 108*  BUN 12  CREATININE 0.68  CALCIUM 9.9   Liver Function Tests:  Recent Labs  10/19/12 1529 10/20/12 0655  AST 83* 66*  ALT 114* 110*  ALKPHOS 93 92  BILITOT 0.2* 0.2*  PROT 8.6* 8.3  ALBUMIN 3.9 3.8   No results found for this basename: LIPASE, AMYLASE,  in the last 72 hours CBC:  Recent Labs  10/20/12 0655  WBC 8.1  HGB 14.9  HCT 42.0  MCV 85.9  PLT 259   Cardiac Enzymes:  Recent Labs  10/18/12 1238  CKTOTAL 260*  TROPONINI <0.30   BNP: No components found with this basename: POCBNP,  D-Dimer: No results found for this basename: DDIMER,  in the last 72 hours Hemoglobin A1C: No results found for this basename: HGBA1C,  in the last 72 hours Fasting Lipid Panel: No results found for this  basename: CHOL, HDL, LDLCALC, TRIG, CHOLHDL, LDLDIRECT,  in the last 72 hours Thyroid Function Tests: No results found for this basename: TSH, T4TOTAL, FREET3, T3FREE, THYROIDAB,  in the last 72 hours Anemia Panel: No results found for this basename: VITAMINB12, FOLATE, FERRITIN, TIBC, IRON, RETICCTPCT,  in the last 72 hours Coag Panel:   No results found for this basename: INR, PROTIME    RADIOLOGY: Dg Chest 2 View  10/17/2012  *RADIOLOGY REPORT*  Clinical Data: Chest pain, shortness of breath, weakness, syncope 4 days ago.  CHEST - 2 VIEW  Comparison: None.  Findings: Shallow inspiration. The heart size and pulmonary vascularity are normal. The lungs appear clear and expanded without focal air space disease or consolidation. No blunting of the costophrenic angles.  No pneumothorax.  Mediastinal contours appear intact.  IMPRESSION: No evidence of active pulmonary disease.   Original Report Authenticated By: Burman Nieves, M.D.    Abd 1 View (kub)  10/18/2012  *RADIOLOGY REPORT*  Clinical Data: 52 year old female with abdominal pain and vomiting.  ABDOMEN - 1 VIEW  Comparison: None  Findings: The bowel gas pattern is unremarkable with  stool and gas in the colon. No dilated bowel loops are present. Cholecystectomy clips are identified. No suspicious calcifications are present. The bony abnormalities are identified.  IMPRESSION: Unremarkable bowel gas pattern.  No evidence of acute abnormality.   Original Report Authenticated By: Harmon Pier, M.D.    Ct Head Wo Contrast  10/20/2012  *RADIOLOGY REPORT*  Clinical Data: Unresponsive.  Code stroke.  CT HEAD WITHOUT CONTRAST  Technique:  Contiguous axial images were obtained from the base of the skull through the vertex without contrast.  Comparison: No priors.  Findings: A small well defined focal area of decreased attenuation in the white matter immediately lateral to the head of the right caudate nucleus, most compatible with an old lacunar infarction.  No acute intracranial abnormalities.  Specifically, no evidence of acute intracranial hemorrhage, no definite findings of acute/subacute cerebral ischemia, no mass, mass effect, hydrocephalus or abnormal intra or extra-axial fluid collections. Visualized paranasal sinuses and mastoids are well pneumatized.  No acute displaced skull fractures are identified.  IMPRESSION: 1.  No acute intracranial abnormality. 2.  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  These results were called by telephone on 10/20/2012 at 09:40 a.m. to Dr., Joselyn Glassman, who verbally acknowledged these results.   Original Report Authenticated By: Trudie Reed, M.D.    Nm Myocar Multi W/spect W/wall Motion / Ef  10/19/2012  *RADIOLOGY REPORT*  Clinical Data:  Chest pain  MYOCARDIAL IMAGING WITH SPECT (REST AND PHARMACOLOGIC-STRESS) GATED LEFT VENTRICULAR WALL MOTION STUDY LEFT VENTRICULAR EJECTION FRACTION  Technique:  Standard myocardial SPECT imaging was performed after resting intravenous injection of 10 mCi Tc-44m sestamibi. Subsequently, intravenous infusion of Lexiscan was performed under the supervision of the Cardiology staff.  At peak effect of the drug, 30 mCi Tc-25m sestamibi was injected intravenously and standard myocardial SPECT  imaging was performed.  Quantitative gated imaging was also performed to evaluate left ventricular wall motion, and estimate left ventricular ejection fraction.  Comparison:  None  Findings: SPECT imaging demonstrates slight area of apparent reversibility within the anterior wall and apex.  This does not to quantify as significant by QPS software (7% reversibility).  No fixed defect to suggest infarct.  Quantitative gated analysis shows normal wall motion.  The resting left ventricular ejection fraction is 47% with end- diastolic volume of 53 ml and end-systolic volume of 33 ml.  IMPRESSION: Subtle area of reversibility in the anterior  wall and apex.  This does not quantify significant by QPS software.  Recommend correlation with clinical symptoms and EKG changes to exclude ischemia.  Ejection fraction 47%.   Original Report Authenticated By: Charlett Nose, M.D.    ASSESSMENT:  1. Atypical and typical chest pain with negative troponin x 3 and normal EKG. CRF include family history of CAD (mom with stents). Nuclear stress test revealed a subtle area of reversibility in the anterior wall and apex with mildly reduced LVF EF 47%.  2. Elevated CPK at outside hospital  3. Transaminitis of ? etiology  4. Unresponsiveness with normal BS - head CT negative and EEG negative.  Alert and oriented this am feeling fine  PLAN:  1. Cardiac cath today to rule out CAD - she ate some of her breakfast so will need to be an afternoon case. 2.  Cardiac catheterization was discussed with the patient fully including risks on myocardial infarction, death, stroke, bleeding, arrhythmia, dye allergy, renal insufficiency or bleeding.  All patient questions and  concerns were discussed and the patient understands and is willing to proceed.         Quintella Reichert, MD  10/21/2012  7:43 AM

## 2012-10-22 NOTE — Discharge Summary (Signed)
Physician Discharge Summary  Stacy Bowman JYN:[id number] DOB: [birth year] DOA: 10/17/2012  PCP: [name], MD  Admit date: 10/17/2012 Discharge date: 10/22/2012  Time spent: 45 minutes  Recommendations for Outpatient Follow-up:  1. Please followup with her primary care doctor in one week following discharge.   Discharge Diagnoses:  Principal Problem:   Chest pain Active Problems:   Syncope   Transaminitis   Elevated CK   Unspecified constipation   Grief reaction   Unresponsive episode   Abnormal cardiovascular stress test  Discharge Condition: Stable  Diet recommendation: Heart healthy  Filed Weights   10/20/12 0524 10/21/12 0500 10/22/12 0454  Weight: 81.6 kg (179 lb 14.3 oz) 81.4 kg (179 lb 7.3 oz) 82.6 kg (182 lb 1.6 oz)   History of present illness:  52 y.o. year old female chest pain. Patient was for similar symptoms at [facility] over the weekend. Per patient and daughter patient had blood work as well as imaging done. Family is on sure results of imaging and tests. Patient was discharged yesterday without a formal diagnosis per report. Patient reports having central chest pain that radiates to the neck jaw/shoulder area with associated nausea and diaphoresis. Symptoms were similar over the weekend. Pain feels like someone sitting on her chest. Pain is sharp 9/10 at its worse. No alleviating or aggravating factors. Pain is not worsened with exertion. No past history of hypertension, diabetes. No family history of early heart attack. Patient also reports some intermittent dizziness with feeling of the room spinning going from sitting to standing. Patient also was having some left-sided weakness/numbness that has been going on the past 3 months. Primary care physician is in [place]. This has been discussed with primary care physician the past. Patient denies a formal workup. Symptoms have been fairly stable with no acute worsening today. Patient also for some  mild abdominal pain. Nonspecific without radiation. Patient does have to strain have a bowel movement.  Hospital Course:  1. Chest pain: with typical and atypical features (left arm and neck pain, diaphoresis, SOB). EKG sinus rhythm, no acute changes. Cardiac enzymes were negative. She underwent a 2-D echocardiogram which showed an EF of 50-55% without any wall motion abnormalities. Following that, she underwent a stress test which showed potential reversibility area in the anterior wall. Finally, on 10/21/2012 she underwent a cardiac catheterization which was unrevealing (full read below). Patient was mildly tachycardic throughout her stay without a clear etiology, showed normal TSH, she is not anemic. She was instructed to followup with her primary care doctor. 2. Syncope, dizziness: Etiology and significance were unclear. While hospitalized, patient had another episode of unresponsiveness on 10/20/2012. She appeared to be sleeping but not waking up to sternal rub. Her vital signs were stable at that time. Cardiology was bedside and were the first to evaluate the patient. Neurology was consulted emergently. She underwent a CAT scan of the head which was unrevealing, as well as an EEG was which which was unrevealing as well.  3. Transaminitis: Etiology and significance unclear. Not on a statin. Repeat study shows improvement. She was advised to followup with her primary care doctor. 4. Fatigue: TSH normal. Suspect secondary to stress, acute grief reaction. 5. Grief, situational stress: Recently buried her sister.  Procedures:  Cardiac cath 10/21/2012 IMPRESSIONS:  Small left main coronary artery, without evidence of atherosclerosis. Normal left anterior descending artery and its branches. Normal left circumflex artery and its branches. Normal right coronary artery. Normal left ventricular systolic function. LVEDP 14 mmHg. Ejection  fraction 60%.  Stress test Subtle area of reversibility in the  anterior wall and apex. This  does not quantify significant by QPS software. Recommend  correlation with clinical symptoms and EKG changes to exclude  ischemia.   2D echo - Left ventricle: The cavity size was normal. Systolic function was normal. The estimated ejection fraction was in the range of 50% to 55%. Wall motion was normal; there were no regional wall motion abnormalities. There was an increased relative contribution of atrial contraction to ventricular filling. Doppler parameters are consistent with abnormal left ventricular relaxation (grade 1 diastolic dysfunction). - Atrial septum: No defect or patent foramen ovale was identified.  Consultations:  Cardiology  Neurology  Discharge Exam: Filed Vitals:   10/21/12 1341 10/21/12 1536 10/21/12 1944 10/22/12 0454  BP:  103/78 123/84 97/61  Pulse: 104 90 102 103  Temp:  97.8 F (36.6 C) 98.1 F (36.7 C) 97.8 F (36.6 C)  TempSrc:  Oral Oral Oral  Resp:  18 18 18   Height:      Weight:    82.6 kg (182 lb 1.6 oz)  SpO2:  100% 98% 98%   General: She is in no acute distress Cardiovascular: Heart is regular, no murmurs Respiratory: Clear to auscultation bilaterally, good air movement. Neuro: Nonfocal    Medication List    TAKE these medications       polyethylene glycol packet  Commonly known as:  MIRALAX / GLYCOLAX  Take 17 g by mouth daily.     SUMAtriptan 50 MG tablet  Commonly known as:  IMITREX  Take 50 mg by mouth every 2 (two) hours as needed for migraine.     venlafaxine XR 75 MG 24 hr capsule  Commonly known as:  EFFEXOR-XR  Take 75 mg by mouth daily.        The results of significant diagnostics from this hospitalization (including imaging, microbiology, ancillary and laboratory) are listed below for reference.    Significant Diagnostic Studies: Dg Chest 2 View  10/17/2012  *RADIOLOGY REPORT*  Clinical Data: Chest pain, shortness of breath, weakness, syncope 4 days ago.  CHEST - 2 VIEW   Comparison: None.  Findings: Shallow inspiration. The heart size and pulmonary vascularity are normal. The lungs appear clear and expanded without focal air space disease or consolidation. No blunting of the costophrenic angles.  No pneumothorax.  Mediastinal contours appear intact.  IMPRESSION: No evidence of active pulmonary disease.   Original Report Authenticated By: Burman Nieves, M.D.    Abd 1 View (kub)  10/18/2012  *RADIOLOGY REPORT*  Clinical Data: 52 year old female with abdominal pain and vomiting.  ABDOMEN - 1 VIEW  Comparison: None  Findings: The bowel gas pattern is unremarkable with stool and gas in the colon. No dilated bowel loops are present. Cholecystectomy clips are identified. No suspicious calcifications are present. The bony abnormalities are identified.  IMPRESSION: Unremarkable bowel gas pattern.  No evidence of acute abnormality.   Original Report Authenticated By: Harmon Pier, M.D.    Ct Head Wo Contrast  10/20/2012  *RADIOLOGY REPORT*  Clinical Data: Unresponsive.  Code stroke.  CT HEAD WITHOUT CONTRAST  Technique:  Contiguous axial images were obtained from the base of the skull through the vertex without contrast.  Comparison: No priors.  Findings: A small well defined focal area of decreased attenuation in the white matter immediately lateral to the head of the right caudate nucleus, most compatible with an old lacunar infarction. No acute intracranial abnormalities.  Specifically, no evidence  of acute intracranial hemorrhage, no definite findings of acute/subacute cerebral ischemia, no mass, mass effect, hydrocephalus or abnormal intra or extra-axial fluid collections. Visualized paranasal sinuses and mastoids are well pneumatized.  No acute displaced skull fractures are identified.  IMPRESSION: 1.  No acute intracranial abnormality. 2.  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  These results were called  by telephone on 10/20/2012 at 09:40 a.m. to Dr., Joselyn Glassman, who verbally acknowledged these results.   Original Report Authenticated By: Trudie Reed, M.D.    Nm Myocar Multi W/spect W/wall Motion / Ef  10/19/2012  *RADIOLOGY REPORT*  Clinical Data:  Chest pain  MYOCARDIAL IMAGING WITH SPECT (REST AND PHARMACOLOGIC-STRESS) GATED LEFT VENTRICULAR WALL MOTION STUDY LEFT VENTRICULAR EJECTION FRACTION  Technique:  Standard myocardial SPECT imaging was performed after resting intravenous injection of 10 mCi Tc-18m sestamibi. Subsequently, intravenous infusion of Lexiscan was performed under the supervision of the Cardiology staff.  At peak effect of the drug, 30 mCi Tc-48m sestamibi was injected intravenously and standard myocardial SPECT  imaging was performed.  Quantitative gated imaging was also performed to evaluate left ventricular wall motion, and estimate left ventricular ejection fraction.  Comparison:  None  Findings: SPECT imaging demonstrates slight area of apparent reversibility within the anterior wall and apex.  This does not to quantify as significant by QPS software (7% reversibility).  No fixed defect to suggest infarct.  Quantitative gated analysis shows normal wall motion.  The resting left ventricular ejection fraction is 47% with end- diastolic volume of 53 ml and end-systolic volume of 33 ml.  IMPRESSION: Subtle area of reversibility in the anterior wall and apex.  This does not quantify significant by QPS software.  Recommend correlation with clinical symptoms and EKG changes to exclude ischemia.  Ejection fraction 47%.   Original Report Authenticated By: Charlett Nose, M.D.     Labs: Basic Metabolic Panel:  Recent Labs Lab 10/17/12 2248 10/18/12 0113 10/18/12 0719 10/20/12 0655 10/21/12 1210  NA 138  --  138 138 137  K 4.1  --  4.0 4.1 4.0  CL 99  --  100 100 100  CO2 28  --  28 26 25   GLUCOSE 119*  --  105* 108* 90  BUN 8  --  10 12 9   CREATININE 0.79  --  0.72 0.68 0.72   CALCIUM 10.4  --  9.8 9.9 9.7  PHOS  --  4.2  --   --   --    Liver Function Tests:  Recent Labs Lab 10/18/12 0719 10/19/12 1529 10/20/12 0655  AST 106* 83* 66*  ALT 106* 114* 110*  ALKPHOS 86 93 92  BILITOT 0.2* 0.2* 0.2*  PROT 7.9 8.6* 8.3  ALBUMIN 3.7 3.9 3.8   CBC:  Recent Labs Lab 10/17/12 2248 10/18/12 0719 10/20/12 0655 10/21/12 1210  WBC 9.6 7.8 8.1 7.7  HGB 14.2 13.2 14.9 14.4  HCT 40.9 37.5 42.0 41.9  MCV 85.7 85.8 85.9 86.4  PLT 272 241 259 229   Cardiac Enzymes:  Recent Labs Lab 10/18/12 0134 10/18/12 0718 10/18/12 1238  CKTOTAL  --   --  260*  TROPONINI <0.30 <0.30 <0.30   CBG:  Recent Labs Lab 10/20/12 0900  GLUCAP 183*    Signed:  Lawren Sexson  [facility] 10/22/2012, 1:24 PM

## 2012-10-22 NOTE — Progress Notes (Signed)
SUBJECTIVE:  No complaints OBJECTIVE:   Vitals:   Filed Vitals:   10/21/12 1341 10/21/12 1536 10/21/12 1944 10/22/12 0454  BP:  103/78 123/84 97/61  Pulse: 104 90 102 103  Temp:  97.8 F (36.6 C) 98.1 F (36.7 C) 97.8 F (36.6 C)  TempSrc:  Oral Oral Oral  Resp:  18 18 18   Height:      Weight:    82.6 kg (182 lb 1.6 oz)  SpO2:  100% 98% 98%   I&O's:   Intake/Output Summary (Last 24 hours) at 10/22/12 9604 Last data filed at 10/21/12 2230  Gross per 24 hour  Intake   1358 ml  Output      0 ml  Net   1358 ml   TELEMETRY: Reviewed telemetry pt in NSR     PHYSICAL EXAM General: Well developed, well nourished, in no acute distress Head: Eyes PERRLA, No xanthomas.   Normal cephalic and atramatic  Lungs:   Clear bilaterally to auscultation and percussion. Heart:   HRRR S1 S2 Pulses are 2+ & equal. Abdomen: Bowel sounds are positive, abdomen soft and non-tender without masses Neuro: Alert and oriented X 3. Psych:  Good affect, responds appropriately EXT:  No hematoma or ecchymosis at right groin cath site   LABS: Basic Metabolic Panel:  Recent Labs  54/09/81 0655 10/21/12 1210  NA 138 137  K 4.1 4.0  CL 100 100  CO2 26 25  GLUCOSE 108* 90  BUN 12 9  CREATININE 0.68 0.72  CALCIUM 9.9 9.7   Liver Function Tests:  Recent Labs  10/19/12 1529 10/20/12 0655  AST 83* 66*  ALT 114* 110*  ALKPHOS 93 92  BILITOT 0.2* 0.2*  PROT 8.6* 8.3  ALBUMIN 3.9 3.8   No results found for this basename: LIPASE, AMYLASE,  in the last 72 hours CBC:  Recent Labs  10/20/12 0655 10/21/12 1210  WBC 8.1 7.7  HGB 14.9 14.4  HCT 42.0 41.9  MCV 85.9 86.4  PLT 259 229   Coag Panel:   Lab Results  Component Value Date   INR 0.87 10/21/2012    RADIOLOGY: Dg Chest 2 View  10/17/2012  *RADIOLOGY REPORT*  Clinical Data: Chest pain, shortness of breath, weakness, syncope 4 days ago.  CHEST - 2 VIEW  Comparison: None.  Findings: Shallow inspiration. The heart size and  pulmonary vascularity are normal. The lungs appear clear and expanded without focal air space disease or consolidation. No blunting of the costophrenic angles.  No pneumothorax.  Mediastinal contours appear intact.  IMPRESSION: No evidence of active pulmonary disease.   Original Report Authenticated By: Burman Nieves, M.D.    Abd 1 View (kub)  10/18/2012  *RADIOLOGY REPORT*  Clinical Data: 52 year old female with abdominal pain and vomiting.  ABDOMEN - 1 VIEW  Comparison: None  Findings: The bowel gas pattern is unremarkable with stool and gas in the colon. No dilated bowel loops are present. Cholecystectomy clips are identified. No suspicious calcifications are present. The bony abnormalities are identified.  IMPRESSION: Unremarkable bowel gas pattern.  No evidence of acute abnormality.   Original Report Authenticated By: Harmon Pier, M.D.    Ct Head Wo Contrast  10/20/2012  *RADIOLOGY REPORT*  Clinical Data: Unresponsive.  Code stroke.  CT HEAD WITHOUT CONTRAST  Technique:  Contiguous axial images were obtained from the base of the skull through the vertex without contrast.  Comparison: No priors.  Findings: A small well defined focal area of decreased attenuation in the  white matter immediately lateral to the head of the right caudate nucleus, most compatible with an old lacunar infarction. No acute intracranial abnormalities.  Specifically, no evidence of acute intracranial hemorrhage, no definite findings of acute/subacute cerebral ischemia, no mass, mass effect, hydrocephalus or abnormal intra or extra-axial fluid collections. Visualized paranasal sinuses and mastoids are well pneumatized.  No acute displaced skull fractures are identified.  IMPRESSION: 1.  No acute intracranial abnormality. 2.  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  These results were called by telephone on 10/20/2012 at 09:40 a.m. to Dr., Joselyn Glassman, who verbally  acknowledged these results.   Original Report Authenticated By: Trudie Reed, M.D.    Nm Myocar Multi W/spect W/wall Motion / Ef  10/19/2012  *RADIOLOGY REPORT*  Clinical Data:  Chest pain  MYOCARDIAL IMAGING WITH SPECT (REST AND PHARMACOLOGIC-STRESS) GATED LEFT VENTRICULAR WALL MOTION STUDY LEFT VENTRICULAR EJECTION FRACTION  Technique:  Standard myocardial SPECT imaging was performed after resting intravenous injection of 10 mCi Tc-22m sestamibi. Subsequently, intravenous infusion of Lexiscan was performed under the supervision of the Cardiology staff.  At peak effect of the drug, 30 mCi Tc-31m sestamibi was injected intravenously and standard myocardial SPECT  imaging was performed.  Quantitative gated imaging was also performed to evaluate left ventricular wall motion, and estimate left ventricular ejection fraction.  Comparison:  None  Findings: SPECT imaging demonstrates slight area of apparent reversibility within the anterior wall and apex.  This does not to quantify as significant by QPS software (7% reversibility).  No fixed defect to suggest infarct.  Quantitative gated analysis shows normal wall motion.  The resting left ventricular ejection fraction is 47% with end- diastolic volume of 53 ml and end-systolic volume of 33 ml.  IMPRESSION: Subtle area of reversibility in the anterior wall and apex.  This does not quantify significant by QPS software.  Recommend correlation with clinical symptoms and EKG changes to exclude ischemia.  Ejection fraction 47%.   Original Report Authenticated By: Charlett Nose, M.D.    ASSESSMENT:  1. Atypical and typical chest pain with negative troponin x 3 and normal EKG. CRF include family history of CAD (mom with stents). Nuclear stress test revealed a subtle area of reversibility in the anterior wall and apex with mildly reduced LVF EF 47%. Cardiac cath yesterday with normal coronary arteries and normal LVF 2. Elevated CPK at outside hospital  3. Transaminitis of  ? etiology  4. Unresponsiveness with normal BS - head CT negative and EEG negative. No further episodes. 5.  Mild resting tachycardia of ? Etiology.  TSH and CBC normal.  D-Dimer normal.  ? anxiety  PLAN:  No new recs will sign off.   Quintella Reichert, MD  10/22/2012  8:24 AM

## 2012-10-22 NOTE — Progress Notes (Signed)
Reviewed dc instructions with patient, verbalized understanding of dc medications, followup , to Call PCP Monday for appointment  In 1 to 2 weeks Stacy Bowman A

## 2014-02-15 ENCOUNTER — Emergency Department (HOSPITAL_COMMUNITY)
Admission: EM | Admit: 2014-02-15 | Discharge: 2014-02-15 | Disposition: A | Discharge status: Home / Self Care | Payer: Medicare Other | Attending: Emergency Medicine | Admitting: Emergency Medicine

## 2014-02-15 ENCOUNTER — Emergency Department (HOSPITAL_COMMUNITY): Payer: Medicare Other

## 2014-02-15 ENCOUNTER — Encounter (HOSPITAL_COMMUNITY): Payer: Self-pay | Admitting: Emergency Medicine

## 2014-02-15 DIAGNOSIS — Z791 Long term (current) use of non-steroidal anti-inflammatories (NSAID): Secondary | ICD-10-CM | POA: Insufficient documentation

## 2014-02-15 DIAGNOSIS — I10 Essential (primary) hypertension: Secondary | ICD-10-CM | POA: Insufficient documentation

## 2014-02-15 DIAGNOSIS — Z87891 Personal history of nicotine dependence: Secondary | ICD-10-CM | POA: Insufficient documentation

## 2014-02-15 DIAGNOSIS — K5900 Constipation, unspecified: Secondary | ICD-10-CM | POA: Insufficient documentation

## 2014-02-15 DIAGNOSIS — G43909 Migraine, unspecified, not intractable, without status migrainosus: Secondary | ICD-10-CM | POA: Insufficient documentation

## 2014-02-15 DIAGNOSIS — Z79899 Other long term (current) drug therapy: Secondary | ICD-10-CM | POA: Insufficient documentation

## 2014-02-15 DIAGNOSIS — F411 Generalized anxiety disorder: Secondary | ICD-10-CM | POA: Insufficient documentation

## 2014-02-15 DIAGNOSIS — R51 Headache: Secondary | ICD-10-CM

## 2014-02-15 DIAGNOSIS — R519 Headache, unspecified: Secondary | ICD-10-CM

## 2014-02-15 HISTORY — DX: Migraine, unspecified, not intractable, without status migrainosus: G43.909

## 2014-02-15 HISTORY — DX: Essential (primary) hypertension: I10

## 2014-02-15 LAB — CSF CELL COUNT WITH DIFFERENTIAL
Eosinophils, CSF: 0 % (ref 0–1)
Eosinophils, CSF: 0 % (ref 0–1)
RBC COUNT CSF: 3 /mm3 — AB
RBC Count, CSF: 1 /mm3 — ABNORMAL HIGH
Tube #: 1
Tube #: 3
WBC CSF: 1 /mm3 (ref 0–5)
WBC CSF: 3 /mm3 (ref 0–5)

## 2014-02-15 LAB — SEDIMENTATION RATE: Sed Rate: 36 mm/hr — ABNORMAL HIGH (ref 0–22)

## 2014-02-15 LAB — CBC WITH DIFFERENTIAL/PLATELET
BASOS PCT: 0 % (ref 0–1)
Basophils Absolute: 0 10*3/uL (ref 0.0–0.1)
Eosinophils Absolute: 0.1 10*3/uL (ref 0.0–0.7)
Eosinophils Relative: 1 % (ref 0–5)
HEMATOCRIT: 39.9 % (ref 36.0–46.0)
HEMOGLOBIN: 13.3 g/dL (ref 12.0–15.0)
LYMPHS PCT: 35 % (ref 12–46)
Lymphs Abs: 3.1 10*3/uL (ref 0.7–4.0)
MCH: 29.4 pg (ref 26.0–34.0)
MCHC: 33.3 g/dL (ref 30.0–36.0)
MCV: 88.3 fL (ref 78.0–100.0)
MONO ABS: 0.8 10*3/uL (ref 0.1–1.0)
MONOS PCT: 9 % (ref 3–12)
NEUTROS ABS: 4.9 10*3/uL (ref 1.7–7.7)
Neutrophils Relative %: 55 % (ref 43–77)
Platelets: 247 10*3/uL (ref 150–400)
RBC: 4.52 MIL/uL (ref 3.87–5.11)
RDW: 12.2 % (ref 11.5–15.5)
WBC: 9 10*3/uL (ref 4.0–10.5)

## 2014-02-15 LAB — BASIC METABOLIC PANEL
BUN: 12 mg/dL (ref 6–23)
CHLORIDE: 100 meq/L (ref 96–112)
CO2: 27 meq/L (ref 19–32)
CREATININE: 0.79 mg/dL (ref 0.50–1.10)
Calcium: 10.4 mg/dL (ref 8.4–10.5)
GFR calc Af Amer: >90 mL/min (ref >90)
GFR calc non Af Amer: >90 mL/min (ref >90)
Glucose, Bld: 153 mg/dL — ABNORMAL HIGH (ref 70–99)
Potassium: 4.4 mEq/L (ref 3.7–5.3)
Sodium: 141 mEq/L (ref 137–147)

## 2014-02-15 LAB — GRAM STAIN

## 2014-02-15 LAB — PROTEIN, CSF: TOTAL PROTEIN, CSF: 53 mg/dL — AB (ref 15–45)

## 2014-02-15 LAB — GLUCOSE, CSF: GLUCOSE CSF: 100 mg/dL — AB (ref 43–76)

## 2014-02-15 MED ORDER — DIPHENHYDRAMINE HCL 50 MG/ML IJ SOLN
25.0000 mg | Freq: Once | INTRAMUSCULAR | Status: AC
  Administered 2014-02-15: 25 mg via INTRAVENOUS
  Filled 2014-02-15: qty 1

## 2014-02-15 MED ORDER — METOCLOPRAMIDE HCL 5 MG/ML IJ SOLN
10.0000 mg | Freq: Once | INTRAMUSCULAR | Status: AC
  Administered 2014-02-15: 10 mg via INTRAVENOUS
  Filled 2014-02-15: qty 2

## 2014-02-15 NOTE — ED Provider Notes (Signed)
CSN: [id number]     Arrival date & time 02/15/14  0654 History   First MD Initiated Contact with Patient 02/15/14 [phone]     Chief Complaint  Patient presents with  . Migraine      Patient is a 53 y.o. female presenting with headaches. The history is provided by the patient.  Headache Pain location:  Generalized Radiates to:  Does not radiate Severity currently:  10/10 Onset quality:  Sudden Duration:  12 hours Timing:  Constant Chronicity:  New Similar to prior headaches: no   Relieved by:  Nothing Worsened by:  Nothing tried Associated symptoms: blurred vision and nausea   Associated symptoms: no fever, no focal weakness and no vomiting   pt reports having HA earlier this week that resolved that was similar to prior headaches She had another HA start last night that was sudden onset at around 8pm.  She reports she has never had this type of HA previously.  She was seen in ER in [facility], reports she had CT scan that was negative and sent home. Her HA continues with nausea and feels dizzy  No h/o stroke per patient No trauma No rash/tick bites reported   Past Medical History  Diagnosis Date  . Anxiety   . Constipation   . Hypertension   . Migraine    Past Surgical History  Procedure Laterality Date  . Tubal ligation    . Cholecystectomy    . Orthopedic surgery     History reviewed. No pertinent family history. History  Substance Use Topics  . Smoking status: Former Games developer  . Smokeless tobacco: Former Neurosurgeon  . Alcohol Use: No   OB History   Grav Para Term Preterm Abortions TAB SAB Ect Mult Living                 Review of Systems  Constitutional: Negative for fever.  Eyes: Positive for blurred vision.  Respiratory: Negative for shortness of breath.   Cardiovascular: Negative for chest pain.  Gastrointestinal: Positive for nausea. Negative for vomiting.  Skin: Negative for rash.  Neurological: Positive for headaches. Negative for focal weakness.  All  other systems reviewed and are negative.     Allergies  Lactose intolerance (gi)  Home Medications   Prior to Admission medications   Medication Sig Start Date End Date Taking? Authorizing Zeth Buday  gabapentin (NEURONTIN) 300 MG capsule Take 300 mg by mouth 3 (three) times daily.   Yes Historical Kelso Bibby, MD  ibuprofen (ADVIL,MOTRIN) 800 MG tablet Take 800 mg by mouth every 8 (eight) hours as needed.   Yes Historical Kamsiyochukwu Spickler, MD  Linaclotide Karlene Einstein) 145 MCG CAPS capsule Take 145 mcg by mouth daily.   Yes Historical Jaselynn Tamas, MD  lisinopril (PRINIVIL,ZESTRIL) 10 MG tablet Take 10 mg by mouth daily.   Yes Historical Jamani Bearce, MD  naproxen (NAPROSYN) 375 MG tablet Take 375 mg by mouth 2 (two) times daily with a meal.   Yes Historical Kane Kusek, MD  SUMAtriptan (IMITREX) 50 MG tablet Take 50 mg by mouth every 2 (two) hours as needed for migraine.   Yes Historical Robbin Escher, MD  traZODone (DESYREL) 100 MG tablet Take 100 mg by mouth at bedtime.   Yes Historical Krisinda Giovanni, MD  venlafaxine XR (EFFEXOR-XR) 150 MG 24 hr capsule Take 150 mg by mouth daily with breakfast. Take with 75mg  capsule   Yes Historical Rusty Villella, MD  venlafaxine XR (EFFEXOR-XR) 75 MG 24 hr capsule Take 75 mg by mouth daily. Take with 150 mg  capsule   Yes Historical Ursula Dermody, MD   BP 141/93  Temp(Src) 98.2 F (36.8 C) (Oral)  Resp 20  Ht 5\' 4"  (1.626 m)  Wt 194 lb (87.998 kg)  BMI 33.28 kg/m2  SpO2 95% Physical Exam CONSTITUTIONAL: Well developed/well nourished, uncomfortable appearing HEAD: Normocephalic/atraumatic EYES: EOMI/PERRL, no nystagmus, no ptosis Pupils pinpoint ENMT: Mucous membranes moist NECK: supple no meningeal signs, no bruits SPINE:entire spine nontender CV: S1/S2 noted, no murmurs/rubs/gallops noted LUNGS: Lungs are clear to auscultation bilaterally, no apparent distress ABDOMEN: soft, nontender, no rebound or guarding NEURO:Awake/alert, facies symmetric, no arm or leg drift is noted Equal  5/5 strength with shoulder abduction, elbow flex/extension, wrist flex/extension in upper extremities and equal hand grips bilaterally Equal 5/5 strength with hip flexion,knee flex/extension, foot dorsi/plantar flexion Cranial nerves 3/4/5/6/03/01/09/11/12 tested and intact Gait normal without ataxia No past pointing Sensation to light touch intact in all extremities EXTREMITIES: pulses normal, full ROM SKIN: warm, color normal PSYCH: no abnormalities of mood noted   ED Course  LUMBAR PUNCTURE Date/Time: 02/15/2014 9:19 AM Performed by: Joya GaskinsWICKLINE, DONALD W Authorized by: Joya GaskinsWICKLINE, DONALD W Consent: written consent obtained. Patient identity confirmed: verbally with patient and arm band Time out: Immediately prior to procedure a "time out" was called to verify the correct patient, procedure, equipment, support staff and site/side marked as required. Indications: evaluation for subarachnoid hemorrhage Anesthesia: local infiltration Local anesthetic: lidocaine 1% without epinephrine Anesthetic total: 3 ml Patient sedated: no Preparation: Patient was prepped and draped in the usual sterile fashion. Lumbar space: L3-L4 interspace Patient's position: sitting Needle gauge: 20 Number of attempts: 1 Fluid appearance: clear Tubes of fluid: 4 Total volume: 8 ml Post-procedure: site cleaned and adhesive bandage applied Patient tolerance: Patient tolerated the procedure well with no immediate complications. Comments: No focal neuro deficits after procedures, moves all extremities x4     7:43 AM Pt with sudden onset of HA that she reports she has never had before.  She has already had one ER evaluation for this headache.  She appears uncomfortable.  Will perform CT head and lumbar puncture to r/o SAH. 9:19 AM Ct head negative LP performed  11:10 AM Pt improved, well appearing CSF analysis reassuring, does not appear c/w SAH.  I doubt meningitis She feels well for d/c home No arm/leg  drift.  No facial droop Stable for d/c home  Labs Review Labs Reviewed  BASIC METABOLIC PANEL - Abnormal; Notable for the following:    Glucose, Bld 153 (*)    All other components within normal limits  CSF CULTURE  GRAM STAIN  CBC WITH DIFFERENTIAL  SEDIMENTATION RATE  CSF CELL COUNT WITH DIFFERENTIAL  CSF CELL COUNT WITH DIFFERENTIAL  GLUCOSE, CSF  PROTEIN, CSF    Imaging Review Ct Head Wo Contrast  02/15/2014   CLINICAL DATA:  Headache  EXAM: CT HEAD WITHOUT CONTRAST  TECHNIQUE: Contiguous axial images were obtained from the base of the skull through the vertex without intravenous contrast.  COMPARISON:  10/20/2012  FINDINGS: No acute hemorrhage, infarct, or mass lesion is identified. No midline shift. Ventricles are normal in size. Orbits and paranasal sinuses are unremarkable. No skull fracture.  IMPRESSION: No acute intracranial findings.  Normal exam.   Electronically Signed   By: Christiana PellantGretchen  Green M.D.   On: 02/15/2014 08:01     EKG Interpretation   Date/Time:  Thursday February 15 2014 07:05:33 EDT Ventricular Rate:  105 PR Interval:  154 QRS Duration: 77 QT Interval:  360 QTC Calculation: 476  R Axis:   71 Text Interpretation:  Sinus tachycardia Confirmed by Bebe ShaggyWICKLINE  MD, Dorinda HillNALD  [phone]) on 02/15/2014 7:12:07 AM      MDM   Final diagnoses:  Nonintractable episodic headache, unspecified headache type    Nursing notes including past medical history and social history reviewed and considered in documentation Labs/vital reviewed and considered     Joya Gaskinsonald W Wickline, MD 02/15/14 1111

## 2014-02-15 NOTE — ED Notes (Signed)
Pt returned from CT °

## 2014-02-15 NOTE — ED Notes (Signed)
Dr. Wickline back at the bedside.  

## 2014-02-15 NOTE — ED Notes (Signed)
Pt brought to ED by [facility] from home c/o HA 10/10, pt states she is been having frequently HA since last Monday 02/12/2014, [place] [facility] [place] reason yesterday and she was release by then with prescriptions. She didn't get her prescription last night and she wake up with the HA again, pt has Hx of Migraines that she doesn't take any medications for. Pt states she is been feeling nauseous but denies any vomiting at this time.

## 2014-02-15 NOTE — Discharge Instructions (Signed)

## 2014-02-18 LAB — CSF CULTURE

## 2014-02-18 LAB — CSF CULTURE W GRAM STAIN: Culture: NO GROWTH

## 2014-08-02 ENCOUNTER — Encounter (HOSPITAL_COMMUNITY): Payer: Self-pay | Admitting: Interventional Cardiology

## 2015-03-08 IMAGING — CT CT HEAD W/O CM
2 series · 16 of 30 positions shown, 20 images · non-contrast
Comparison: 10/20/2012

CLINICAL DATA: Headache

EXAM:
CT HEAD WITHOUT CONTRAST
TECHNIQUE: Contiguous axial images were obtained from the base of the skull
through the vertex without intravenous contrast.

[Series 201: head w/o, idose (1) · axial · non-contrast · 0.49mm/px · z∈[+90,+220]mm · 13 of 32 slices shown, 17 images]
[im 3/32  brain]
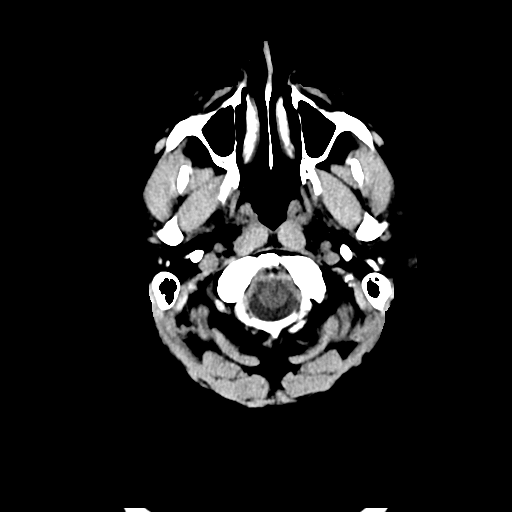
[im 3/32  bone]
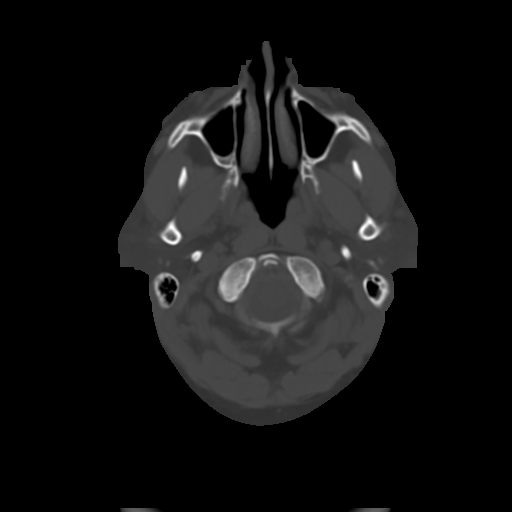
[im 5/32  brain]
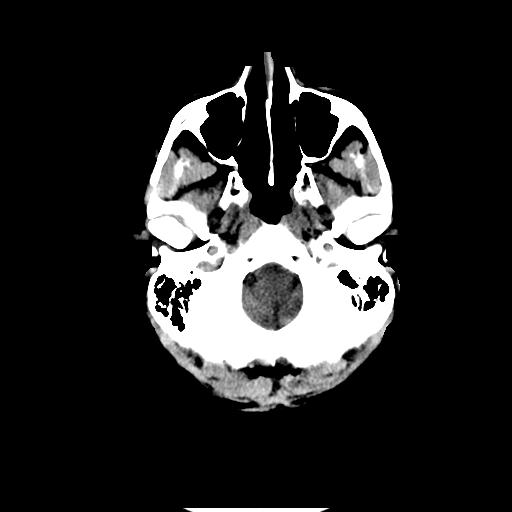
[im 7/32  brain]
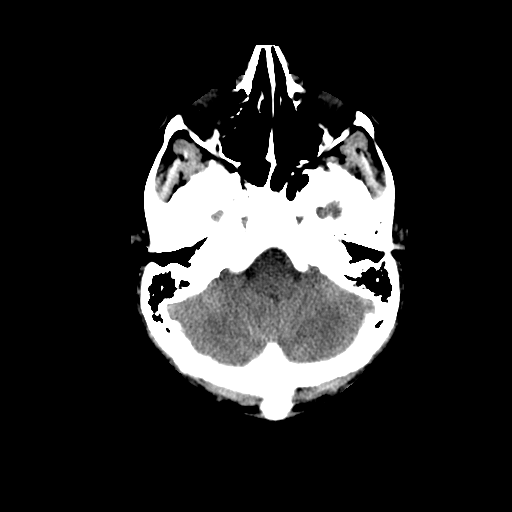
[im 9/32  brain]
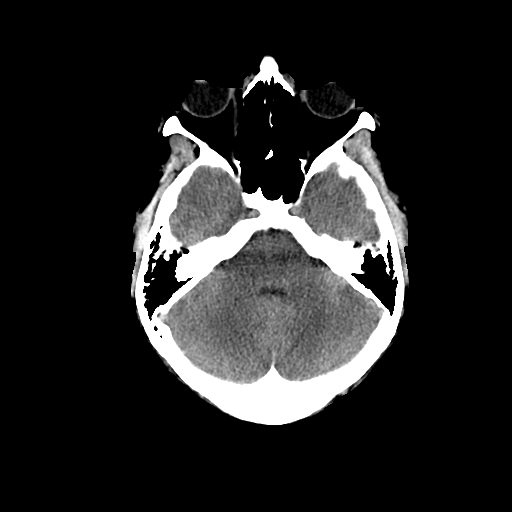
[im 12/32  brain]
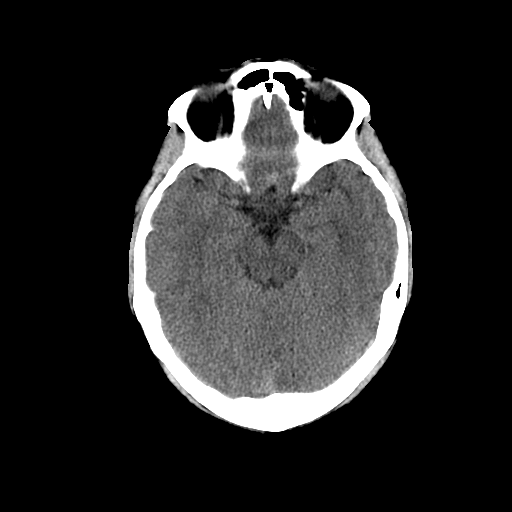
[im 12/32  bone]
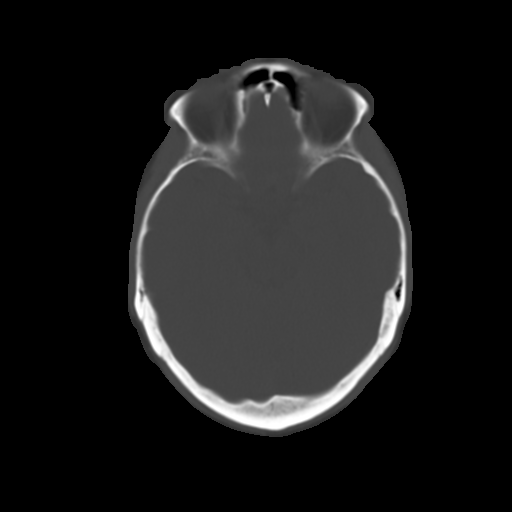
[im 14/32  brain]
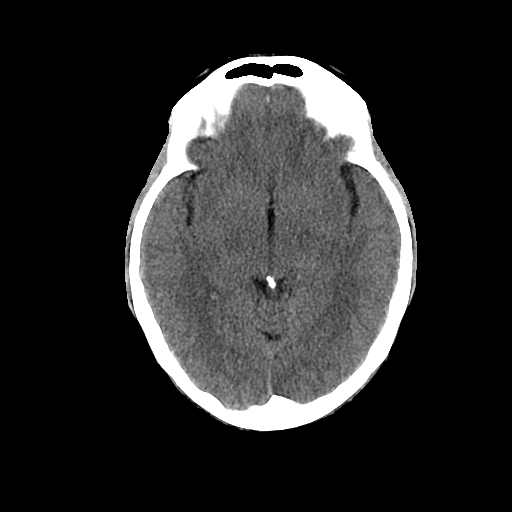
[im 16/32  brain]
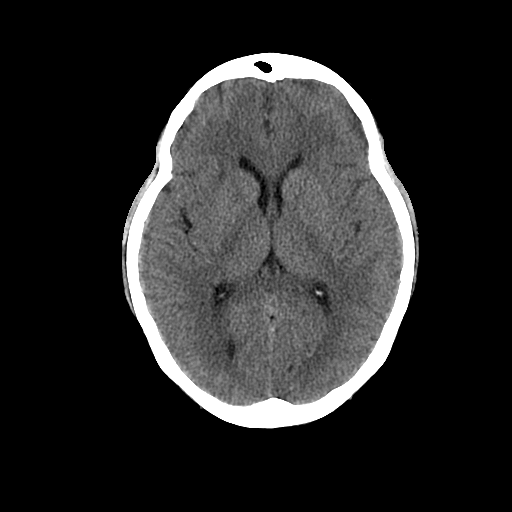
[im 18/32  brain]
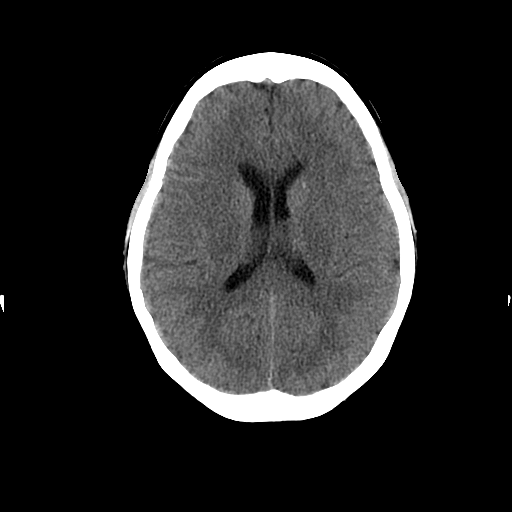
[im 20/32  brain]
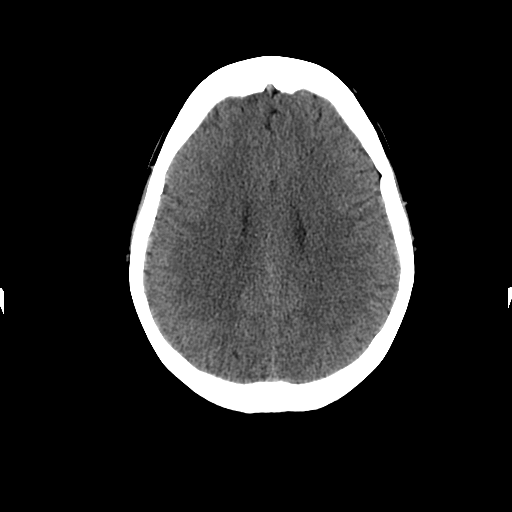
[im 20/32  bone]
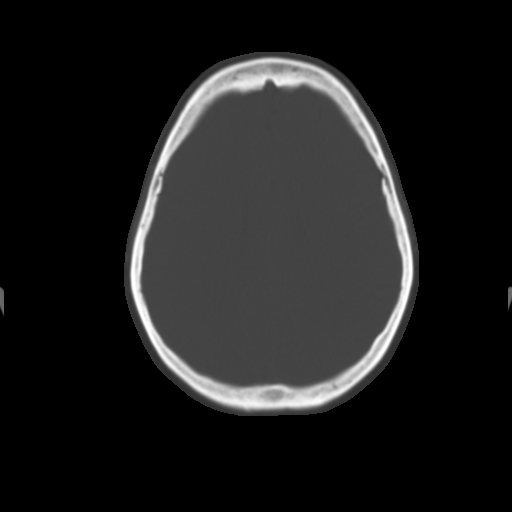
[im 23/32  brain]
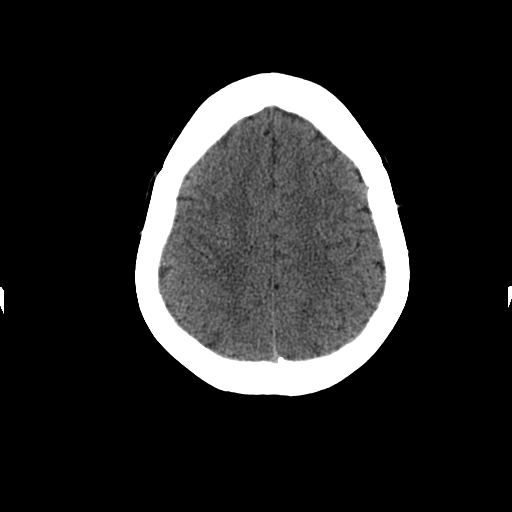
[im 25/32  brain]
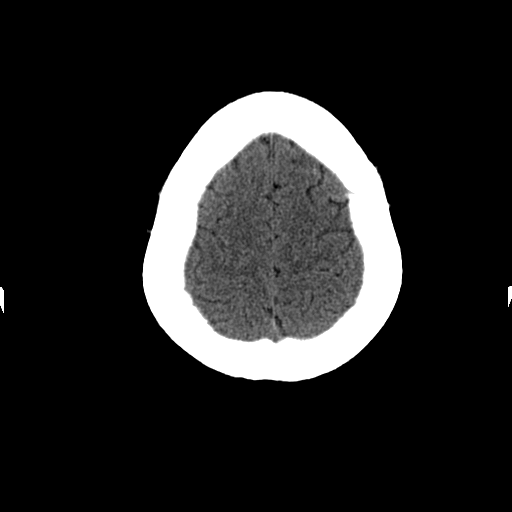
[im 27/32  brain]
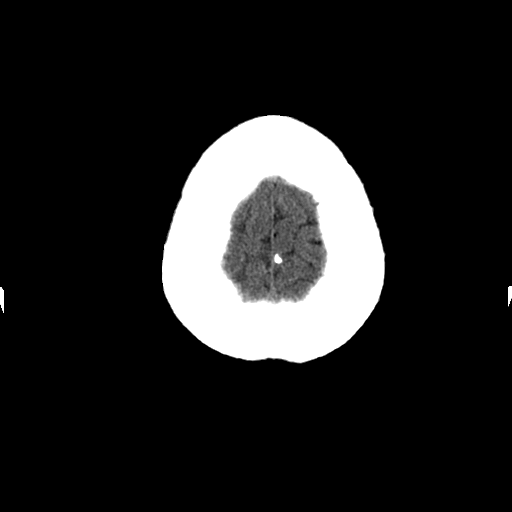
[im 29/32  brain]
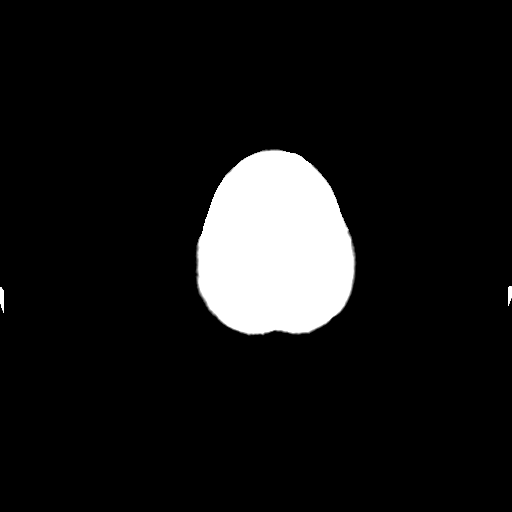
[im 29/32  bone]
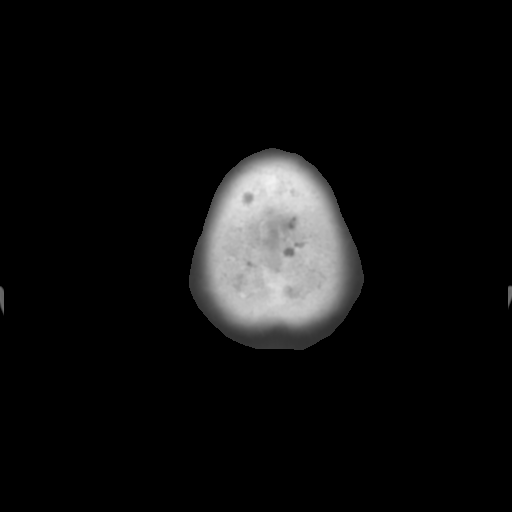

[Series 202: head w/o bone, idose (1) · axial · non-contrast · 0.49mm/px · z∈[+90,+135]mm · 3 of 32 slices shown]
[im 3/32  bone]
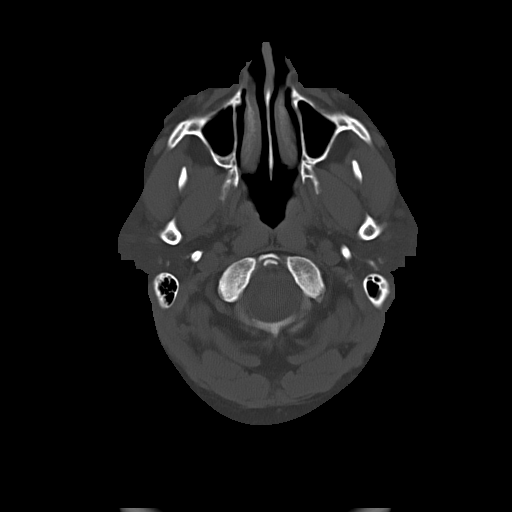
[im 7/32  bone]
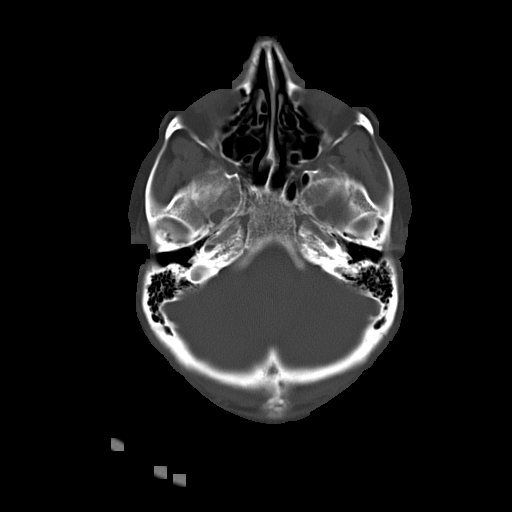
[im 12/32  bone]
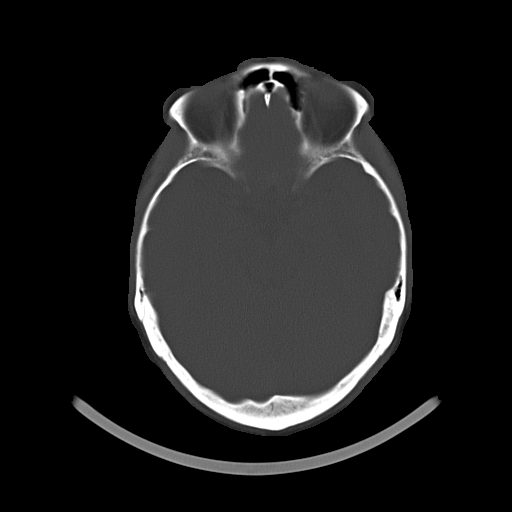

[16 of 30 positions shown; findings below may reference images not displayed]

FINDINGS: No acute hemorrhage, infarct, or mass lesion is identified. No
midline shift. Ventricles are normal in size. Orbits and paranasal
sinuses are unremarkable. No skull fracture.
IMPRESSION: No acute intracranial findings.  Normal exam.
